# Patient Record
Sex: Female | Born: 1955 | Race: Black or African American | Hispanic: No | Marital: Married | Smoking: Never smoker
Health system: Southern US, Community
[De-identification: ages and names within clinical notes are randomized; demographics above are authoritative.]

## PROBLEM LIST (undated history)

## (undated) DIAGNOSIS — K219 Gastro-esophageal reflux disease without esophagitis: Secondary | ICD-10-CM

## (undated) DIAGNOSIS — E7800 Pure hypercholesterolemia, unspecified: Secondary | ICD-10-CM

## (undated) DIAGNOSIS — I10 Essential (primary) hypertension: Secondary | ICD-10-CM

## (undated) DIAGNOSIS — I890 Lymphedema, not elsewhere classified: Secondary | ICD-10-CM

## (undated) DIAGNOSIS — T7840XA Allergy, unspecified, initial encounter: Secondary | ICD-10-CM

## (undated) DIAGNOSIS — E039 Hypothyroidism, unspecified: Secondary | ICD-10-CM

## (undated) DIAGNOSIS — E0500 Thyrotoxicosis with diffuse goiter without thyrotoxic crisis or storm: Secondary | ICD-10-CM

## (undated) HISTORY — DX: Allergy, unspecified, initial encounter: T78.40XA

## (undated) HISTORY — DX: Pure hypercholesterolemia, unspecified: E78.00

## (undated) HISTORY — DX: Thyrotoxicosis with diffuse goiter without thyrotoxic crisis or storm: E05.00

## (undated) HISTORY — DX: Lymphedema, not elsewhere classified: I89.0

## (undated) HISTORY — DX: Gastro-esophageal reflux disease without esophagitis: K21.9

## (undated) HISTORY — DX: Hypothyroidism, unspecified: E03.9

## (undated) HISTORY — DX: Essential (primary) hypertension: I10

## (undated) HISTORY — PX: TUBAL LIGATION: SHX77

---

## 1997-12-22 ENCOUNTER — Ambulatory Visit (HOSPITAL_COMMUNITY): Admission: RE | Admit: 1997-12-22 | Discharge: 1997-12-22 | Payer: Self-pay | Admitting: Internal Medicine

## 1998-12-20 ENCOUNTER — Ambulatory Visit (HOSPITAL_COMMUNITY): Admission: RE | Admit: 1998-12-20 | Discharge: 1998-12-20 | Payer: Self-pay | Admitting: *Deleted

## 1999-12-25 ENCOUNTER — Other Ambulatory Visit: Admission: RE | Admit: 1999-12-25 | Discharge: 1999-12-25 | Payer: Self-pay | Admitting: *Deleted

## 2000-02-21 ENCOUNTER — Encounter: Payer: Self-pay | Admitting: *Deleted

## 2000-02-21 ENCOUNTER — Ambulatory Visit (HOSPITAL_COMMUNITY): Admission: RE | Admit: 2000-02-21 | Discharge: 2000-02-21 | Payer: Self-pay | Admitting: *Deleted

## 2001-07-25 ENCOUNTER — Ambulatory Visit (HOSPITAL_COMMUNITY): Admission: RE | Admit: 2001-07-25 | Discharge: 2001-07-25 | Payer: Self-pay | Admitting: Family Medicine

## 2003-11-03 ENCOUNTER — Other Ambulatory Visit: Admission: RE | Admit: 2003-11-03 | Discharge: 2003-11-03 | Payer: Self-pay | Admitting: Family Medicine

## 2004-09-06 ENCOUNTER — Encounter: Admission: RE | Admit: 2004-09-06 | Discharge: 2004-09-06 | Payer: Self-pay | Admitting: Allergy and Immunology

## 2005-09-08 HISTORY — PX: ABLATION: SHX5711

## 2005-11-11 ENCOUNTER — Ambulatory Visit (HOSPITAL_COMMUNITY): Admission: RE | Admit: 2005-11-11 | Discharge: 2005-11-11 | Payer: Self-pay | Admitting: Family Medicine

## 2005-11-18 ENCOUNTER — Other Ambulatory Visit: Admission: RE | Admit: 2005-11-18 | Discharge: 2005-11-18 | Payer: Self-pay | Admitting: Family Medicine

## 2006-01-08 ENCOUNTER — Encounter (HOSPITAL_COMMUNITY): Admission: RE | Admit: 2006-01-08 | Discharge: 2006-04-08 | Payer: Self-pay | Admitting: Family Medicine

## 2006-02-06 ENCOUNTER — Ambulatory Visit (HOSPITAL_COMMUNITY): Admission: RE | Admit: 2006-02-06 | Discharge: 2006-02-06 | Payer: Self-pay | Admitting: Family Medicine

## 2006-10-10 ENCOUNTER — Emergency Department (HOSPITAL_COMMUNITY): Admission: EM | Admit: 2006-10-10 | Discharge: 2006-10-10 | Payer: Self-pay | Admitting: Family Medicine

## 2006-10-10 ENCOUNTER — Inpatient Hospital Stay (HOSPITAL_COMMUNITY): Admission: AD | Admit: 2006-10-10 | Discharge: 2006-10-14 | Payer: Self-pay | Admitting: Internal Medicine

## 2006-10-11 ENCOUNTER — Ambulatory Visit: Payer: Self-pay | Admitting: Vascular Surgery

## 2006-11-30 ENCOUNTER — Other Ambulatory Visit: Admission: RE | Admit: 2006-11-30 | Discharge: 2006-11-30 | Payer: Self-pay | Admitting: Family Medicine

## 2006-12-01 ENCOUNTER — Ambulatory Visit (HOSPITAL_COMMUNITY): Admission: RE | Admit: 2006-12-01 | Discharge: 2006-12-01 | Payer: Self-pay | Admitting: Family Medicine

## 2007-11-30 ENCOUNTER — Other Ambulatory Visit: Admission: RE | Admit: 2007-11-30 | Discharge: 2007-11-30 | Payer: Self-pay | Admitting: Family Medicine

## 2007-12-07 ENCOUNTER — Ambulatory Visit (HOSPITAL_COMMUNITY): Admission: RE | Admit: 2007-12-07 | Discharge: 2007-12-07 | Payer: Self-pay | Admitting: Family Medicine

## 2008-09-08 HISTORY — PX: UTERINE FIBROID EMBOLIZATION: SHX825

## 2008-10-11 ENCOUNTER — Encounter: Admission: RE | Admit: 2008-10-11 | Discharge: 2008-10-11 | Payer: Self-pay | Admitting: Obstetrics and Gynecology

## 2008-10-24 ENCOUNTER — Encounter: Admission: RE | Admit: 2008-10-24 | Discharge: 2008-10-24 | Payer: Self-pay | Admitting: Obstetrics and Gynecology

## 2008-11-21 ENCOUNTER — Ambulatory Visit (HOSPITAL_COMMUNITY): Admission: RE | Admit: 2008-11-21 | Discharge: 2008-11-22 | Payer: Self-pay | Admitting: Interventional Radiology

## 2008-12-06 ENCOUNTER — Encounter: Admission: RE | Admit: 2008-12-06 | Discharge: 2008-12-06 | Payer: Self-pay | Admitting: Interventional Radiology

## 2008-12-15 ENCOUNTER — Ambulatory Visit (HOSPITAL_COMMUNITY): Admission: RE | Admit: 2008-12-15 | Discharge: 2008-12-15 | Payer: Self-pay | Admitting: Family Medicine

## 2009-04-05 ENCOUNTER — Ambulatory Visit (HOSPITAL_COMMUNITY): Admission: RE | Admit: 2009-04-05 | Discharge: 2009-04-05 | Payer: Self-pay | Admitting: Gastroenterology

## 2009-04-05 ENCOUNTER — Encounter (INDEPENDENT_AMBULATORY_CARE_PROVIDER_SITE_OTHER): Payer: Self-pay | Admitting: Gastroenterology

## 2009-05-27 IMAGING — XA IR UTERINE FIBROID EMBOLIZATION
1 series · 14 of 24 positions shown · non-contrast
Comparison: none

CLINICAL DATA: symptomatic uterine fibroids.

 BILATERAL UTERINE ARTERY EMBOLIZATION:
TECHNIQUE: The procedure, risks, benefits, and alternatives were
explained to the patient.  Questions regarding the procedure were
encouraged and answered.  The patient understands and consents to
the procedure. Prophylactic antibiotics were ordered and
administered within one hour of procedure start time. An
appropriate skin entry site was determined under fluoroscopy. Skin
site was marked, prepped with Betadine, and draped in usual sterile
fashion, and infiltrated locally with 1% lidocaine. Intravenous
fentanyl and Versed were administered as conscious sedation during
continuous cardiorespiratory monitoring by the radiology RN.

[Series 1000: run · 0.10mm/px · 14 of 39 slices shown]
[im 1/39]
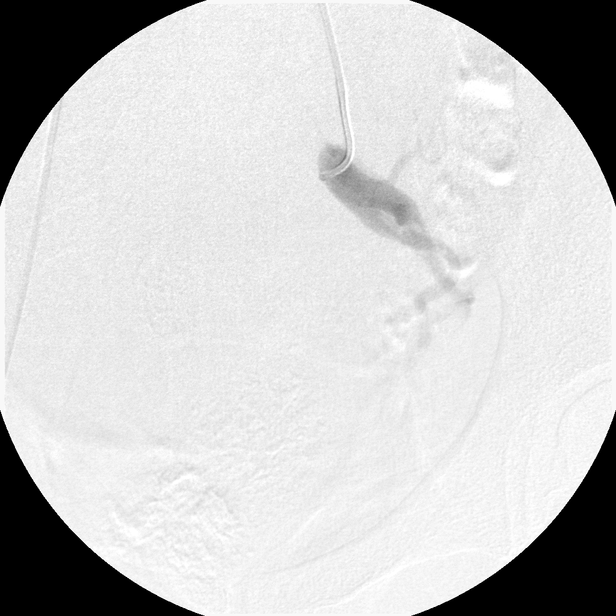
[im 4/39]
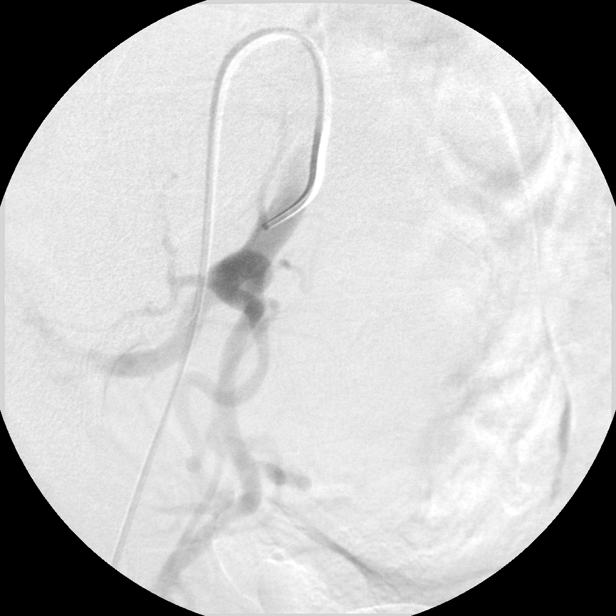
[im 7/39]
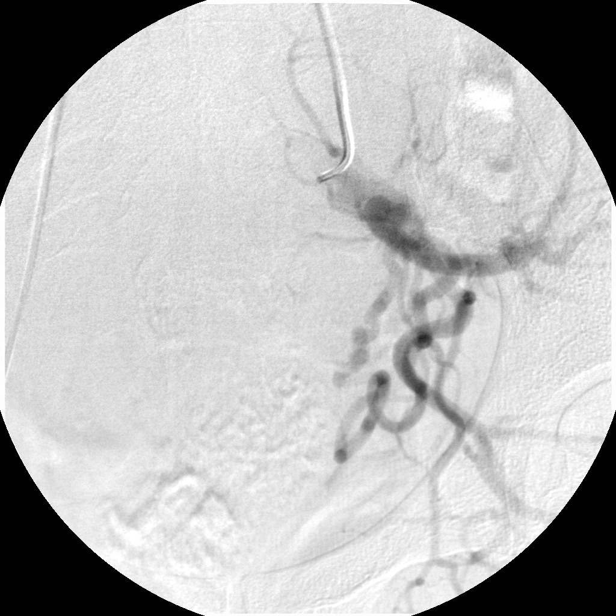
[im 10/39]
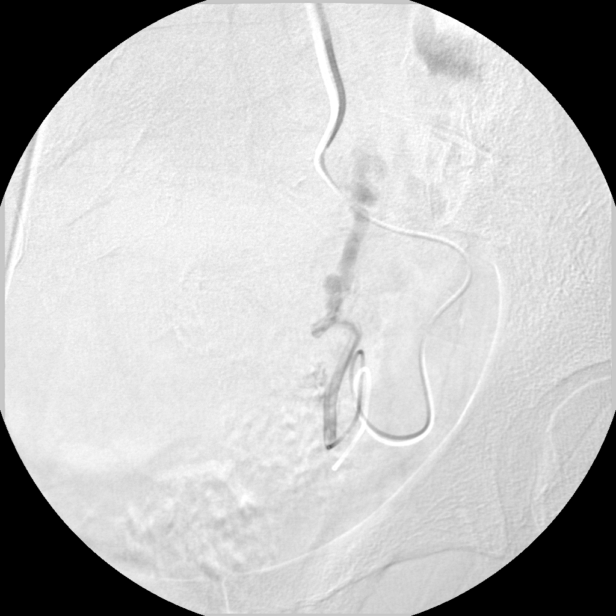
[im 12/39]
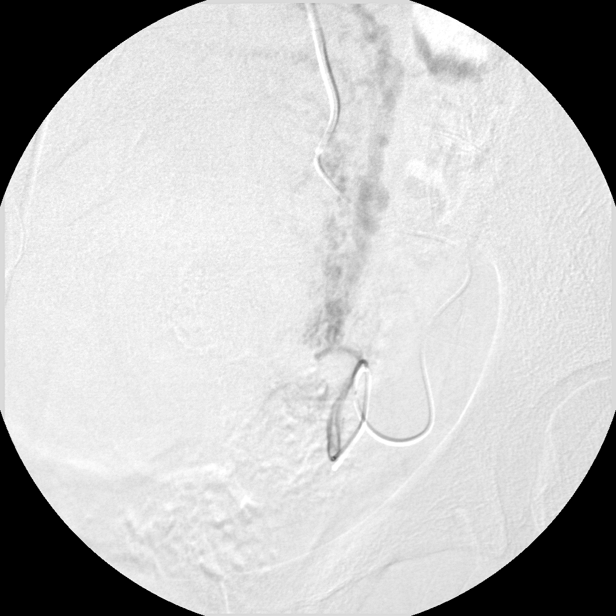
[im 15/39]
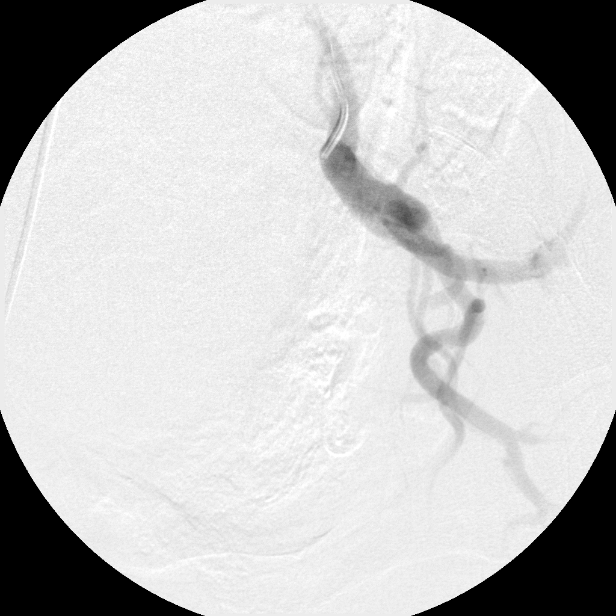
[im 19/39]
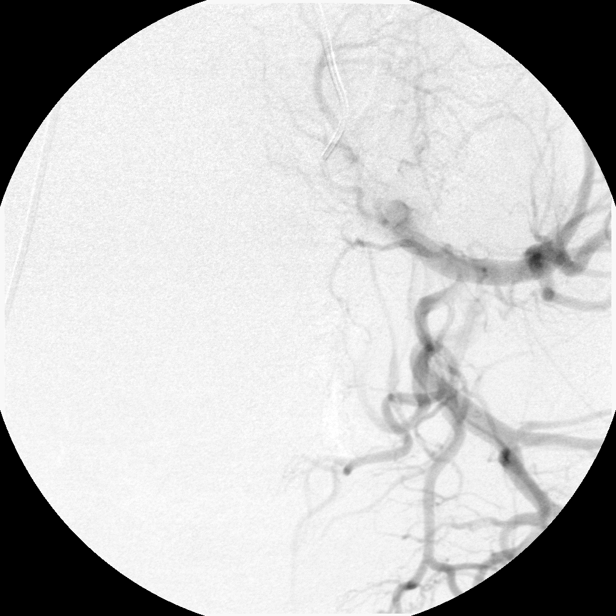
[im 20/39]
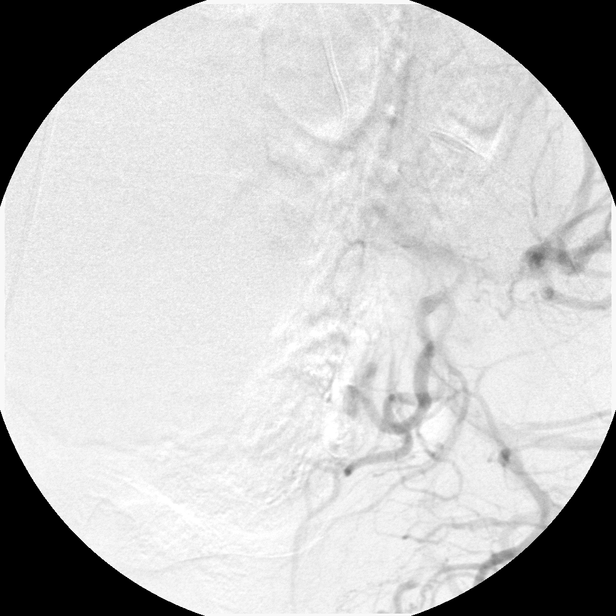
[im 24/39]
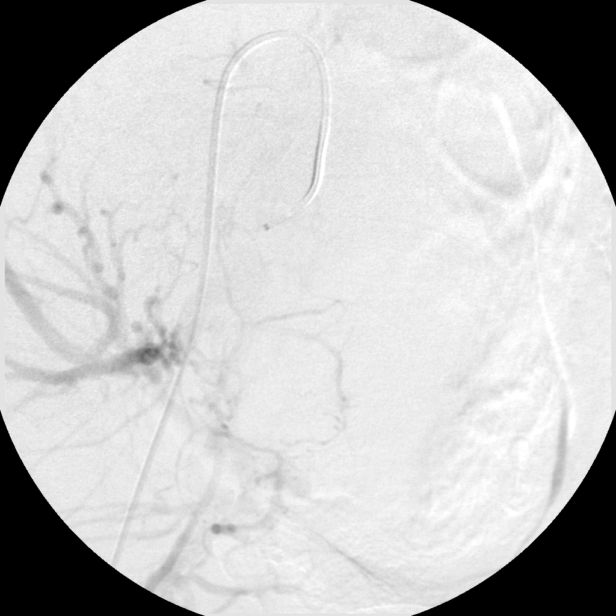
[im 27/39]
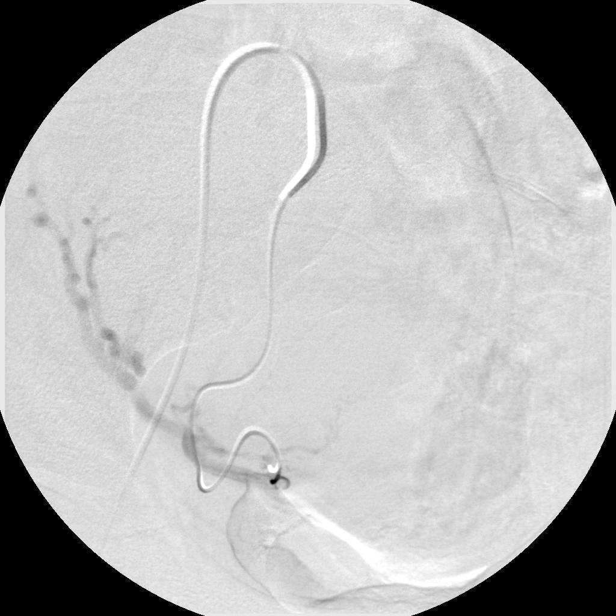
[im 30/39]
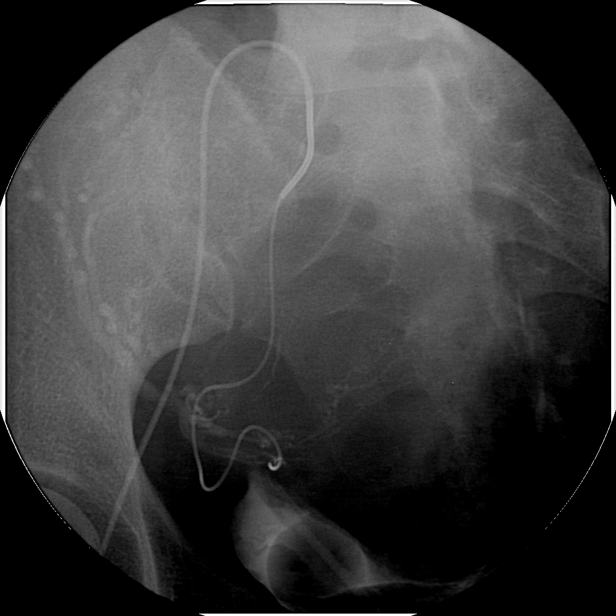
[im 32/39]
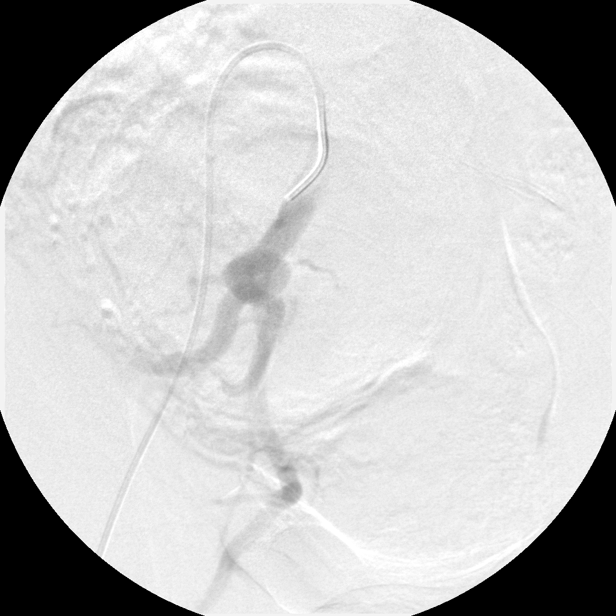
[im 35/39]
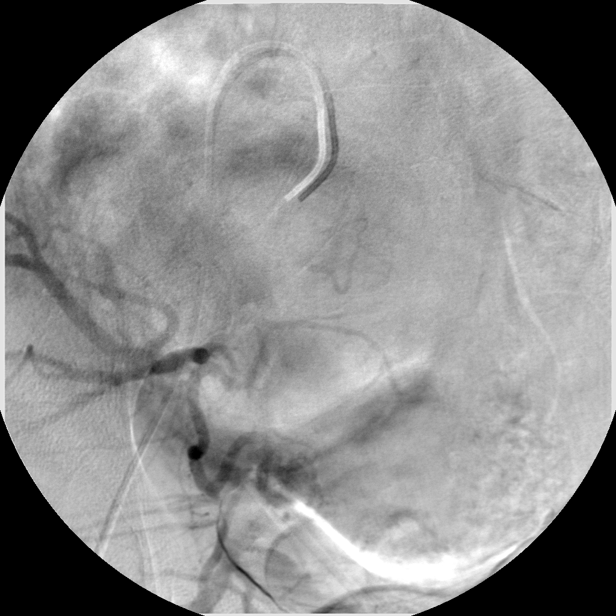
[im 39/39]
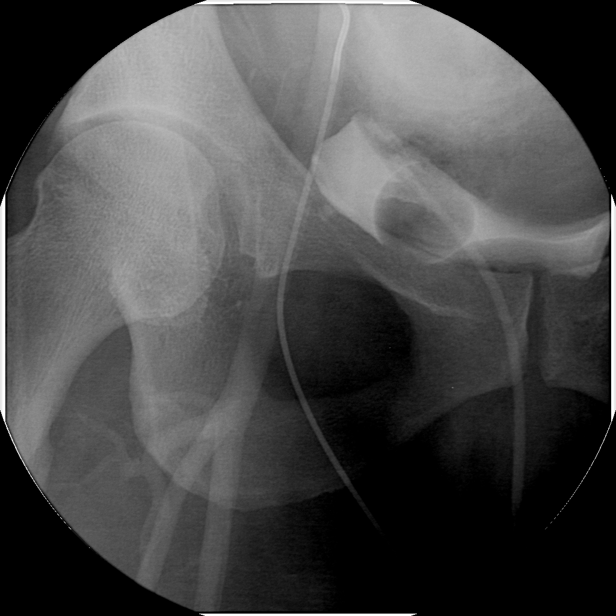

[14 of 24 positions shown; findings below may reference images not displayed]

Under real-time ultrasound guidance, the right common femoral
artery was accessed with a 19-gauge needle using singlewall
technique in a single pass. Ultrasound imaging documentation was
saved. Needle was exchanged over a Benson wire for a 5 French C2
catheter, used to selectively catheterize the left internal iliac
artery for pelvic arteriography. A coaxial Progreat catheter was
advanced with the Terumo wire and used to selectively catheterize
the left uterine artery. The microcatheter tip was positioned in
the distal horizontal segment. Selective arteriogram confirms
appropriate positioning. Distal branches of the left uterine artery
were embolized with 500-700 micron Embospheres.  Embolization
continued until near stasis of flow was achieved. Microcatheter was
withdrawn and a followup selective left internal iliac arteriogram
was obtained. Woo Chul Jeohn loop was then formed with the C2 catheter,
and the right internal iliac artery was selectively catheterized.
Again the Progreat catheter with Terumo guidewire was coaxially
advanced and used to selectively catheterize the right uterine
artery. Confirmatory arteriogram was performed.  Right uterine
artery branches were embolized with 500-700 micron Embospheres to
near stasis of flow. A total of 8 vials of Embospheres were
utilized for the case. Microcatheter was withdrawn and a followup
arteriogram of the right internal iliac artery was performed. C2
catheter was exchanged over the Benson wire for a vascular sheath
allowing delivery of the Starclose device external to the right
common femoral artery. Hemostasis was achieved. No immediate
complication. Patient tolerated the procedure well.
IMPRESSION: 1. Technically successful bilateral uterine artery embolization
using 500-722micron Embospheres.

## 2009-05-30 ENCOUNTER — Encounter: Admission: RE | Admit: 2009-05-30 | Discharge: 2009-05-30 | Payer: Self-pay | Admitting: Obstetrics and Gynecology

## 2009-07-05 ENCOUNTER — Other Ambulatory Visit: Admission: RE | Admit: 2009-07-05 | Discharge: 2009-07-05 | Payer: Self-pay | Admitting: Obstetrics and Gynecology

## 2009-12-17 ENCOUNTER — Ambulatory Visit (HOSPITAL_COMMUNITY): Admission: RE | Admit: 2009-12-17 | Discharge: 2009-12-17 | Payer: Self-pay | Admitting: Family Medicine

## 2010-07-08 ENCOUNTER — Other Ambulatory Visit: Admission: RE | Admit: 2010-07-08 | Discharge: 2010-07-08 | Payer: Self-pay | Admitting: Obstetrics and Gynecology

## 2010-09-10 ENCOUNTER — Ambulatory Visit
Admission: RE | Admit: 2010-09-10 | Discharge: 2010-09-10 | Payer: Self-pay | Source: Home / Self Care | Attending: Vascular Surgery | Admitting: Vascular Surgery

## 2010-09-18 ENCOUNTER — Ambulatory Visit
Admission: RE | Admit: 2010-09-18 | Discharge: 2010-09-18 | Payer: Self-pay | Source: Home / Self Care | Attending: Vascular Surgery | Admitting: Vascular Surgery

## 2010-12-03 ENCOUNTER — Other Ambulatory Visit (HOSPITAL_COMMUNITY): Payer: Self-pay | Admitting: Family Medicine

## 2010-12-03 DIAGNOSIS — Z1231 Encounter for screening mammogram for malignant neoplasm of breast: Secondary | ICD-10-CM

## 2010-12-19 ENCOUNTER — Ambulatory Visit (HOSPITAL_COMMUNITY)
Admission: RE | Admit: 2010-12-19 | Discharge: 2010-12-19 | Disposition: A | Discharge status: Home / Self Care | Payer: 59 | Source: Ambulatory Visit | Attending: Family Medicine | Admitting: Family Medicine

## 2010-12-19 DIAGNOSIS — Z1231 Encounter for screening mammogram for malignant neoplasm of breast: Secondary | ICD-10-CM | POA: Insufficient documentation

## 2010-12-19 LAB — CBC
Hemoglobin: 10.6 g/dL — ABNORMAL LOW (ref 12.0–15.0)
MCV: 79.9 fL (ref 78.0–100.0)
Platelets: 229 10*3/uL (ref 150–400)
RBC: 4.16 MIL/uL (ref 3.87–5.11)
RDW: 16.9 % — ABNORMAL HIGH (ref 11.5–15.5)
WBC: 4.2 10*3/uL (ref 4.0–10.5)

## 2010-12-19 LAB — HCG, SERUM, QUALITATIVE: Preg, Serum: NEGATIVE

## 2010-12-19 LAB — CREATININE, SERUM: GFR calc non Af Amer: >60 mL/min (ref >60)

## 2011-01-21 NOTE — Op Note (Signed)
NAME:  [name], [name]               ACCOUNT NO.:  [id number]   MEDICAL RECORD NO.:  [id number]          PATIENT TYPE:  AMB   LOCATION:  ENDO                         FACILITY:  [facility]   PHYSICIAN:  Graylin Shiver, M.D.   DATE OF BIRTH:  [birth year]   DATE OF PROCEDURE:  04/05/2009  DATE OF DISCHARGE:                               OPERATIVE REPORT   PROCEDURE:  Colonoscopy with biopsy.   INDICATION:  Screening.   Informed consent was obtained after explanation of the risks of  bleeding, infection and perforation.   PREMEDICATION:  Fentanyl 75 mcg IV, Versed 7.5 mg IV.   PROCEDURE IN DETAIL:  With the patient in the left lateral decubitus  position a rectal exam was performed and no masses were felt.  The  Pentax colonoscope was inserted into the rectum and advanced around the  colon to the cecum.  Cecal landmarks were identified.  The cecum looked  normal.  There were some scattered diverticula in the descending colon  and transverse colon.  In the transverse colon there was a diminutive  polyp which was biopsied off with cold forceps.  The descending colon  showed a few diverticula.  The sigmoid and rectum were normal.  She  tolerated the procedure well without complications.   IMPRESSION:  1. Scattered diverticulosis.  2. Diminutive polyp in the transverse colon biopsied off.   PLAN:  Pathology will be checked.           ______________________________  Graylin Shiver, M.D.     SFG/MEDQ  D:  04/05/2009  T:  04/05/2009  Job:  [id number]   cc:   Gretta Arab. Valentina Lucks, M.D.  Fax: [phone]

## 2011-01-21 NOTE — Consult Note (Signed)
NEW PATIENT CONSULTATION   OWEN, PAGNOTTA C  DOB:  1955-11-11                                       09/10/2010  FAOZH#:[id number]   The patient presents today for evaluation of right greater than left leg  swelling.  She reports this has been present for quite a few years and  has had several episodes of cellulitis in her right leg related to the  swelling.  She had tried diuretic treatment with minimal response.  She  has been diagnosed as right leg lymphedema.  She does not have any  history of DVT.  She has responded to antibiotics when she has developed  cellulitis in her right leg.  She is not diabetic.  She does have  history of thyrotoxicosis and no history of cardiac disease.   SOCIAL HISTORY:  She is married with 3 children.  She does not work  outside the home.  She does not smoke or drink alcohol.   FAMILY HISTORY:  Negative for premature atherosclerotic disease.   REVIEW OF SYSTEMS:  She does have weight gain up to 190 pounds.  She is  5 feet 1 inch tall.  She does have some pain in her heels with walking.  Review of systems otherwise negative.   PHYSICAL EXAMINATION:  Vital signs:  Blood pressure is 137/76, heart  rate is 81, respirations 16, oxygen saturations are 100% on room air.  HEENT:  Normal.  Her radial and dorsalis pedis pulses are 2+  bilaterally.  She does have edema in her right leg and mild in her left  leg.  This is more typical of lymphedema extending down on to the dorsum  of her foot and also on to her toes.  Neurologic:  No focal weakness,  paresthesias.  Skin:  She does not have any open ulcers or rashes.  Musculoskeletal:  There are no major deformities or cyanosis.   She underwent screening ultrasound with myself with the handheld duplex  and this shows normal function of her saphenous vein bilaterally.  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  I  explained that to the patient that there is no  known specific etiology  of this and no surgical treatment.  I did explain the importance of  compression and potentially therapeutic massage as well.  We have  referred her to a lymphedema specialist, Oleh Genin, and have made  this referral for further recommendations regarding therapeutic massage.  She was fitted with knee-high 20 to 30 mmHg graduated compression  stockings today and instructed on the use of these pending her  appointment with the lymphedema specialist.     Larina Earthly, M.D.  Electronically Signed   TFE/MEDQ  D:  09/10/2010  T:  09/10/2010  Job:  [id number]   cc:   Gretta Arab. Valentina Lucks, M.D.

## 2011-01-24 NOTE — H&P (Signed)
NAMESEANA, [name]               ACCOUNT NO.:  [id number]   MEDICAL RECORD NO.:  [id number]          PATIENT TYPE:  INP   LOCATION:  3005                         FACILITY:  [facility]   PHYSICIAN:  Kela Millin, M.D.DATE OF BIRTH:  Aug 27, 1956   DATE OF ADMISSION:  10/10/2006  DATE OF DISCHARGE:                              HISTORY & PHYSICAL   PRIMARY CARE PHYSICIAN:  Dr. Maurice Small.   CHIEF COMPLAINT:  Worsening right leg swelling and pain.   HISTORY OF PRESENT ILLNESS:  The patient is a 55 year old black female  with past medical history significant for hypothyroidism and recently  diagnosed with right lower extremity cellulitis and started on oral  antibiotics per primary care physician who presents with above  complaints.  She states that she was in her usual state of health until  about a week ago when she developed some swelling in her right leg.  By  the next day, it became more painful and also red and so she went in to  see her primary care physician and the physician assistant started her  on empiric antibiotics as well as hydrochlorothiazide for the swelling  and this was done about 5 days ago.  She states that the redness  improved after she started the antibiotics but the swelling and pain  have continued to worsen and so she came to Urgent Care today and was  directly admitted to the [facility] hospitalist service for further  evaluation and management.  The patient denies any insect bites, also  denies any trauma.  She admits to a history of childhood eczema.  She  denies fevers, cough, dysuria, chest pain, melena, diarrhea, and no  hematochezia.  She states that the swelling is mostly on the posterior  medial aspect of her right leg from her upper thigh area all the way  down to her ankle area.   PAST MEDICAL HISTORY:  As stated above.   MEDICATIONS:  1. Synthroid 125 mcg p.o. daily.  2. Septra DS one tablet p.o. b.i.d.  3. Hydrochlorothiazide 12.5 mg p.o.  daily.   ALLERGIES/INTOLERANCES:  HYDROCODONE.   SOCIAL HISTORY:  She denies tobacco.  She also denies alcohol.   FAMILY HISTORY:  Her mother has hypertension.  Her grandmother also had  hypertension.  Her sister has breast cancer.   REVIEW OF SYSTEMS:  As per HPI, other review of systems negative.   PHYSICAL EXAMINATION:  GENERAL:  The patient is a pleasant middle-aged  black female well-developed, well-nourished in no respiratory distress.  VITAL SIGNS:  Temperature is 98.5.  There is a pulse of 94.  Respiratory  rate of 20.  Blood pressure is 117/75.  O2 saturation of 97%.  HEENT:  PERRL, EOMI.  Sclerae are anicteric.  Moist mucous membranes.  No oral exudates.  NECK:  Supple.  No adenopathy.  No thyromegaly.  No JVD.  LUNGS:  Clear to auscultation bilaterally; no crackles or wheezes.  CARDIOVASCULAR:  Regular rate and rhythm.  Normal S1 and S2.  No  murmurs.  No gallops.  ABDOMEN:  Soft.  Bowel sounds present.  Nontender.  Nondistended.  No  organomegaly.  No masses palpable.  EXTREMITIES:  Her right lower extremity is significantly larger in  circumference than the left extremity and on the posterior medial aspect  from her upper thigh down to her ankle area is discolored, edematous,  tender.  Left lower extremity:  No edema.  No cyanosis.  NEURO:  Alert and oriented x3.  Cranial nerves II-XII are grossly  intact.  A nonfocal exam.   LABORATORY DATA:  Her white cell count is 4.2, hemoglobin initially 11.6  with a hematocrit of 34 and on repeat labs of a hemoglobin 8.9 with a  hematocrit of 28.6 and an MCV 73.4 and her platelet count 346.  Her  sedimentation rate is 53.  Her pH is 7.39 with a PCO2 of 39.5, a  bicarbonate of 24.2.   ASSESSMENT/PLAN:  1. Right lower extremity cellulitis with ?lymphedema.  We will obtain      a Doppler ultrasound to rule out a DVT.  We will start on empiric      antibiotics including MRSA coverage.  We will also obtain cultures      and  chemistries including liver function tests and follow.  2. Hypothyroidism-continue Synthroid.  3. Anemia, microcytic-recheck H&H, anemia workup and follow.      Kela Millin, M.D.  Electronically Signed     ACV/MEDQ  D:  10/10/2006  T:  10/10/2006  Job:  [id number]   cc:   Gretta Arab. Valentina Lucks, M.D.

## 2011-01-24 NOTE — Discharge Summary (Signed)
[name], [name]               ACCOUNT NO.:  [id number]   MEDICAL RECORD NO.:  [id number]          PATIENT TYPE:  INP   LOCATION:  3005                         FACILITY:  [facility]   PHYSICIAN:  Hollice Espy, M.D.DATE OF BIRTH:  1956-01-15   DATE OF ADMISSION:  10/10/2006  DATE OF DISCHARGE:  10/14/2006                               DISCHARGE SUMMARY   DISCHARGE DIAGNOSES:  1. Cellulitis of the right inner thigh.  2. Chronic iron deficiency anemia, felt to be secondary to number 3.  3. Heavy menses.  4. History of hypothyroidism.   DISCHARGE MEDICATIONS:  For this patient are as follows:  1. New medication of iron 325 mg p.o. b.i.d.  2. IV Levaquin 500 mg daily for the next 6 days.  This medication may      substituted to IV Avalox 400 mg daily x6 days pending on patient's      home health agent and insurance covering that.  Patient will, otherwise, continue the rest of her previous medications,  these are as follows:  1. Synthroid 0.25 mcg daily.  2. __________ 5 p.o. daily.  3. __________ .   HOSPITAL COURSE:  The patient is a very pleasant 55 year old white  female with past medical history as outlined above who was admitted to  the hospital on October 10, 2006 after failing outpatient therapy for  her right inner thigh cellulitis.  She has significant erythema and  induration.  She was started on IV Unasyn and vancomycin.  Her symptoms  quickly regressed and initially the plan was by then, as of October 13, 2006, to discharge the patient on p.o. antibiotics.  By October 14, 2006, patient was feeling better.  Her erythema had improved; however,  she still had significant amount of induration.  I was concerned about  the penetration of the oral antibiotics to complete her antibiotic  course and felt it best that she continue on IV antibiotics.  Since she  was, otherwise, was doing well it was felt best the patient could go  home onIV antibiotics  daily and daily nebs  for ease of use and the fact  that she has no complicating, such as diabetes with this medication.  The patient will be discharged home for another 6 more days and she will  followup next week with her PCP, Dr. Maurice Small, who at that time  will evaluate the patient to see if she needs any further antibiotic  therapy.  In regards to her iron deficiency anemia, labs were checked on  this patient &  she was found to have a low hemoglobin of 8.7.  She  showed no signs of GI blood loss, but had stated that she had her menses  recently and every time she has her menses they are quite heavy.  An MPE  was checked and found to be low at 73.  It was strongly felt the patient  had chronic iron deficiency anemia and decreased platelets.  We had  started the patient on iron 325 p.o. b.i.d.  The rest of her medical  issues, at  this time, remain stable.  She is being discharged to home to  follow up with her PCP in the next week.  Home health to follow the  patient.   DISCHARGE DIET:  Regular diet.   ACTIVITIES:  Slow to increase      Hollice Espy, M.D.  Electronically Signed     SKK/MEDQ  D:  10/14/2006  T:  10/15/2006  Job:  [id number]   cc:   Gretta Arab. Valentina Lucks, M.D.

## 2011-07-09 ENCOUNTER — Other Ambulatory Visit: Payer: Self-pay | Admitting: Obstetrics and Gynecology

## 2011-07-09 ENCOUNTER — Other Ambulatory Visit (HOSPITAL_COMMUNITY)
Admission: RE | Admit: 2011-07-09 | Discharge: 2011-07-09 | Disposition: A | Discharge status: Home / Self Care | Payer: 59 | Source: Ambulatory Visit | Attending: Obstetrics and Gynecology | Admitting: Obstetrics and Gynecology

## 2011-07-09 DIAGNOSIS — Z01419 Encounter for gynecological examination (general) (routine) without abnormal findings: Secondary | ICD-10-CM | POA: Insufficient documentation

## 2011-12-18 ENCOUNTER — Other Ambulatory Visit (HOSPITAL_COMMUNITY): Payer: Self-pay | Admitting: Family Medicine

## 2011-12-18 DIAGNOSIS — Z1231 Encounter for screening mammogram for malignant neoplasm of breast: Secondary | ICD-10-CM

## 2012-01-01 ENCOUNTER — Ambulatory Visit (HOSPITAL_COMMUNITY)
Admission: RE | Admit: 2012-01-01 | Discharge: 2012-01-01 | Disposition: A | Discharge status: Home / Self Care | Payer: 59 | Source: Ambulatory Visit | Attending: Family Medicine | Admitting: Family Medicine

## 2012-01-01 DIAGNOSIS — Z1231 Encounter for screening mammogram for malignant neoplasm of breast: Secondary | ICD-10-CM | POA: Insufficient documentation

## 2012-07-08 ENCOUNTER — Other Ambulatory Visit: Payer: Self-pay | Admitting: Obstetrics and Gynecology

## 2012-07-08 ENCOUNTER — Other Ambulatory Visit (HOSPITAL_COMMUNITY)
Admission: RE | Admit: 2012-07-08 | Discharge: 2012-07-08 | Disposition: A | Discharge status: Home / Self Care | Payer: 59 | Source: Ambulatory Visit | Attending: Obstetrics and Gynecology | Admitting: Obstetrics and Gynecology

## 2012-07-08 DIAGNOSIS — Z01419 Encounter for gynecological examination (general) (routine) without abnormal findings: Secondary | ICD-10-CM | POA: Insufficient documentation

## 2012-12-07 ENCOUNTER — Other Ambulatory Visit (HOSPITAL_COMMUNITY): Payer: Self-pay | Admitting: Family Medicine

## 2012-12-07 DIAGNOSIS — Z1231 Encounter for screening mammogram for malignant neoplasm of breast: Secondary | ICD-10-CM

## 2013-01-03 ENCOUNTER — Ambulatory Visit (HOSPITAL_COMMUNITY)
Admission: RE | Admit: 2013-01-03 | Discharge: 2013-01-03 | Disposition: A | Discharge status: Home / Self Care | Payer: 59 | Source: Ambulatory Visit | Attending: Family Medicine | Admitting: Family Medicine

## 2013-01-03 DIAGNOSIS — Z1231 Encounter for screening mammogram for malignant neoplasm of breast: Secondary | ICD-10-CM | POA: Insufficient documentation

## 2013-07-12 ENCOUNTER — Other Ambulatory Visit (HOSPITAL_COMMUNITY)
Admission: RE | Admit: 2013-07-12 | Discharge: 2013-07-12 | Disposition: A | Discharge status: Home / Self Care | Payer: 59 | Source: Ambulatory Visit | Attending: Obstetrics and Gynecology | Admitting: Obstetrics and Gynecology

## 2013-07-12 ENCOUNTER — Other Ambulatory Visit: Payer: Self-pay | Admitting: Obstetrics and Gynecology

## 2013-07-12 DIAGNOSIS — Z1151 Encounter for screening for human papillomavirus (HPV): Secondary | ICD-10-CM | POA: Insufficient documentation

## 2013-07-12 DIAGNOSIS — Z01419 Encounter for gynecological examination (general) (routine) without abnormal findings: Secondary | ICD-10-CM | POA: Insufficient documentation

## 2013-11-30 ENCOUNTER — Other Ambulatory Visit: Payer: Self-pay

## 2013-11-30 DIAGNOSIS — R002 Palpitations: Secondary | ICD-10-CM

## 2013-12-06 ENCOUNTER — Other Ambulatory Visit (HOSPITAL_COMMUNITY): Payer: Self-pay | Admitting: Cardiology

## 2013-12-06 DIAGNOSIS — R0989 Other specified symptoms and signs involving the circulatory and respiratory systems: Secondary | ICD-10-CM

## 2013-12-07 ENCOUNTER — Encounter (INDEPENDENT_AMBULATORY_CARE_PROVIDER_SITE_OTHER): Payer: 59

## 2013-12-07 ENCOUNTER — Encounter: Payer: Self-pay | Admitting: Cardiology

## 2013-12-07 ENCOUNTER — Ambulatory Visit (HOSPITAL_COMMUNITY): Payer: 59 | Attending: Cardiology | Admitting: Cardiology

## 2013-12-07 ENCOUNTER — Encounter: Payer: Self-pay | Admitting: *Deleted

## 2013-12-07 DIAGNOSIS — I6529 Occlusion and stenosis of unspecified carotid artery: Secondary | ICD-10-CM

## 2013-12-07 DIAGNOSIS — R002 Palpitations: Secondary | ICD-10-CM

## 2013-12-07 DIAGNOSIS — R0989 Other specified symptoms and signs involving the circulatory and respiratory systems: Secondary | ICD-10-CM

## 2013-12-07 DIAGNOSIS — R42 Dizziness and giddiness: Secondary | ICD-10-CM

## 2013-12-07 NOTE — Progress Notes (Signed)
Patient ID: [name], female   DOB: 1956-07-29, 58 y.o.   MRN: [id number] [employer] 30 day cardiac event monitor applied to patient.

## 2013-12-07 NOTE — Progress Notes (Signed)
Carotid duplex complete 

## 2014-02-02 ENCOUNTER — Other Ambulatory Visit (HOSPITAL_COMMUNITY): Payer: Self-pay | Admitting: Family Medicine

## 2014-02-02 DIAGNOSIS — Z1231 Encounter for screening mammogram for malignant neoplasm of breast: Secondary | ICD-10-CM

## 2014-02-15 ENCOUNTER — Ambulatory Visit (HOSPITAL_COMMUNITY)
Admission: RE | Admit: 2014-02-15 | Discharge: 2014-02-15 | Disposition: A | Discharge status: Home / Self Care | Payer: 59 | Source: Ambulatory Visit | Attending: Family Medicine | Admitting: Family Medicine

## 2014-02-15 DIAGNOSIS — Z803 Family history of malignant neoplasm of breast: Secondary | ICD-10-CM | POA: Insufficient documentation

## 2014-02-15 DIAGNOSIS — Z1231 Encounter for screening mammogram for malignant neoplasm of breast: Secondary | ICD-10-CM | POA: Insufficient documentation

## 2014-07-12 ENCOUNTER — Other Ambulatory Visit (HOSPITAL_COMMUNITY)
Admission: RE | Admit: 2014-07-12 | Discharge: 2014-07-12 | Disposition: A | Discharge status: Home / Self Care | Payer: 59 | Source: Ambulatory Visit | Attending: Obstetrics and Gynecology | Admitting: Obstetrics and Gynecology

## 2014-07-12 ENCOUNTER — Other Ambulatory Visit: Payer: Self-pay | Admitting: Obstetrics and Gynecology

## 2014-07-12 DIAGNOSIS — Z01419 Encounter for gynecological examination (general) (routine) without abnormal findings: Secondary | ICD-10-CM | POA: Insufficient documentation

## 2014-07-13 LAB — CYTOLOGY - PAP

## 2014-11-23 ENCOUNTER — Other Ambulatory Visit (HOSPITAL_COMMUNITY): Payer: Self-pay | Admitting: Cardiology

## 2014-11-23 DIAGNOSIS — I6523 Occlusion and stenosis of bilateral carotid arteries: Secondary | ICD-10-CM

## 2014-11-24 ENCOUNTER — Ambulatory Visit (HOSPITAL_COMMUNITY): Payer: 59 | Attending: Cardiology | Admitting: *Deleted

## 2014-11-24 DIAGNOSIS — I6523 Occlusion and stenosis of bilateral carotid arteries: Secondary | ICD-10-CM | POA: Diagnosis not present

## 2014-11-24 DIAGNOSIS — R42 Dizziness and giddiness: Secondary | ICD-10-CM

## 2014-11-24 DIAGNOSIS — R0989 Other specified symptoms and signs involving the circulatory and respiratory systems: Secondary | ICD-10-CM

## 2014-11-24 NOTE — Progress Notes (Signed)
Carotid Duplex Scan Performed 

## 2015-02-13 ENCOUNTER — Other Ambulatory Visit (HOSPITAL_COMMUNITY): Payer: Self-pay | Admitting: Family Medicine

## 2015-02-13 DIAGNOSIS — Z1231 Encounter for screening mammogram for malignant neoplasm of breast: Secondary | ICD-10-CM

## 2015-02-20 ENCOUNTER — Ambulatory Visit (HOSPITAL_COMMUNITY)
Admission: RE | Admit: 2015-02-20 | Discharge: 2015-02-20 | Disposition: A | Discharge status: Home / Self Care | Payer: 59 | Source: Ambulatory Visit | Attending: Family Medicine | Admitting: Family Medicine

## 2015-02-20 DIAGNOSIS — Z1231 Encounter for screening mammogram for malignant neoplasm of breast: Secondary | ICD-10-CM | POA: Diagnosis not present

## 2015-07-17 ENCOUNTER — Other Ambulatory Visit: Payer: Self-pay | Admitting: Obstetrics and Gynecology

## 2015-07-17 ENCOUNTER — Other Ambulatory Visit (HOSPITAL_COMMUNITY)
Admission: RE | Admit: 2015-07-17 | Discharge: 2015-07-17 | Disposition: A | Discharge status: Home / Self Care | Payer: 59 | Source: Ambulatory Visit | Attending: Obstetrics and Gynecology | Admitting: Obstetrics and Gynecology

## 2015-07-17 DIAGNOSIS — Z01419 Encounter for gynecological examination (general) (routine) without abnormal findings: Secondary | ICD-10-CM | POA: Insufficient documentation

## 2015-07-18 LAB — CYTOLOGY - PAP

## 2015-09-10 MED FILL — SYNTHROID 112 MCG TABLET: 112 | 30 days supply | Qty: 30 | Fill #4

## 2015-09-10 MED FILL — CALCIUM 600 + VIT D 400 TAB: 600-400 | 75 days supply | Qty: 150 | Fill #2

## 2015-10-08 MED FILL — SYNTHROID 112 MCG TABLET: 112 | 30 days supply | Qty: 30 | Fill #5

## 2015-10-31 DIAGNOSIS — I10 Essential (primary) hypertension: Secondary | ICD-10-CM | POA: Diagnosis not present

## 2015-10-31 DIAGNOSIS — E7800 Pure hypercholesterolemia, unspecified: Secondary | ICD-10-CM | POA: Diagnosis not present

## 2015-10-31 DIAGNOSIS — E039 Hypothyroidism, unspecified: Secondary | ICD-10-CM | POA: Diagnosis not present

## 2015-10-31 DIAGNOSIS — I890 Lymphedema, not elsewhere classified: Secondary | ICD-10-CM | POA: Diagnosis not present

## 2015-11-06 DIAGNOSIS — I890 Lymphedema, not elsewhere classified: Secondary | ICD-10-CM | POA: Diagnosis not present

## 2015-11-06 MED FILL — SYNTHROID 112 MCG TABLET: 112 | 30 days supply | Qty: 30 | Fill #0

## 2015-11-13 MED FILL — DOXYCYCLINE HYCLATE 100 MG: 100 | 10 days supply | Qty: 20 | Fill #0

## 2015-11-19 DIAGNOSIS — I890 Lymphedema, not elsewhere classified: Secondary | ICD-10-CM | POA: Diagnosis not present

## 2015-11-21 DIAGNOSIS — I890 Lymphedema, not elsewhere classified: Secondary | ICD-10-CM | POA: Diagnosis not present

## 2015-11-23 DIAGNOSIS — I890 Lymphedema, not elsewhere classified: Secondary | ICD-10-CM | POA: Diagnosis not present

## 2015-11-27 DIAGNOSIS — I890 Lymphedema, not elsewhere classified: Secondary | ICD-10-CM | POA: Diagnosis not present

## 2015-11-29 DIAGNOSIS — I890 Lymphedema, not elsewhere classified: Secondary | ICD-10-CM | POA: Diagnosis not present

## 2015-12-03 DIAGNOSIS — I890 Lymphedema, not elsewhere classified: Secondary | ICD-10-CM | POA: Diagnosis not present

## 2015-12-05 DIAGNOSIS — I890 Lymphedema, not elsewhere classified: Secondary | ICD-10-CM | POA: Diagnosis not present

## 2015-12-05 MED FILL — LOSARTAN-HCTZ 100-12.5 MG T: 100-12.5 | 30 days supply | Qty: 30 | Fill #0

## 2015-12-05 MED FILL — SYNTHROID 112 MCG TABLET: 112 | 30 days supply | Qty: 30 | Fill #1

## 2015-12-07 DIAGNOSIS — I890 Lymphedema, not elsewhere classified: Secondary | ICD-10-CM | POA: Diagnosis not present

## 2015-12-12 DIAGNOSIS — I10 Essential (primary) hypertension: Secondary | ICD-10-CM | POA: Diagnosis not present

## 2016-01-07 MED FILL — SYNTHROID 112 MCG TABLET: 112 | 30 days supply | Qty: 30 | Fill #2

## 2016-02-06 MED FILL — SYNTHROID 112 MCG TABLET: 112 | 30 days supply | Qty: 30 | Fill #3

## 2016-02-13 ENCOUNTER — Other Ambulatory Visit: Payer: Self-pay | Admitting: Family Medicine

## 2016-02-13 ENCOUNTER — Other Ambulatory Visit: Payer: Self-pay

## 2016-02-13 DIAGNOSIS — Z1231 Encounter for screening mammogram for malignant neoplasm of breast: Secondary | ICD-10-CM

## 2016-02-29 ENCOUNTER — Ambulatory Visit
Admission: RE | Admit: 2016-02-29 | Discharge: 2016-02-29 | Disposition: A | Discharge status: Home / Self Care | Payer: 59 | Source: Ambulatory Visit | Attending: Family Medicine | Admitting: Family Medicine

## 2016-02-29 DIAGNOSIS — Z1231 Encounter for screening mammogram for malignant neoplasm of breast: Secondary | ICD-10-CM

## 2016-03-06 MED FILL — SYNTHROID 112 MCG TABLET: 112 | 30 days supply | Qty: 30 | Fill #4

## 2016-03-28 MED FILL — TRIAMCINOLONE 0.1% CREAM: 0.1 | 10 days supply | Qty: 15 | Fill #2

## 2016-04-07 MED FILL — SYNTHROID 112 MCG TABLET: 112 | 30 days supply | Qty: 30 | Fill #5

## 2016-04-11 DIAGNOSIS — E039 Hypothyroidism, unspecified: Secondary | ICD-10-CM | POA: Diagnosis not present

## 2016-04-11 DIAGNOSIS — E7800 Pure hypercholesterolemia, unspecified: Secondary | ICD-10-CM | POA: Diagnosis not present

## 2016-04-11 DIAGNOSIS — Z0000 Encounter for general adult medical examination without abnormal findings: Secondary | ICD-10-CM | POA: Diagnosis not present

## 2016-04-11 DIAGNOSIS — I10 Essential (primary) hypertension: Secondary | ICD-10-CM | POA: Diagnosis not present

## 2016-04-11 DIAGNOSIS — I890 Lymphedema, not elsewhere classified: Secondary | ICD-10-CM | POA: Diagnosis not present

## 2016-04-11 DIAGNOSIS — D649 Anemia, unspecified: Secondary | ICD-10-CM | POA: Diagnosis not present

## 2016-05-02 MED FILL — LOSARTAN-HCTZ 100-12.5 MG T: 100-12.5 | 90 days supply | Qty: 90 | Fill #0

## 2016-05-05 MED FILL — SYNTHROID 112 MCG TABLET: 112 | 30 days supply | Qty: 30 | Fill #0

## 2016-06-06 MED FILL — SYNTHROID 112 MCG TABLET: 112 | 30 days supply | Qty: 30 | Fill #1

## 2016-07-07 MED FILL — SYNTHROID 112 MCG TABLET: 112 | 30 days supply | Qty: 30 | Fill #2

## 2016-07-18 ENCOUNTER — Other Ambulatory Visit (HOSPITAL_COMMUNITY)
Admission: RE | Admit: 2016-07-18 | Discharge: 2016-07-18 | Disposition: A | Discharge status: Home / Self Care | Payer: 59 | Source: Ambulatory Visit | Attending: Obstetrics and Gynecology | Admitting: Obstetrics and Gynecology

## 2016-07-18 ENCOUNTER — Other Ambulatory Visit: Payer: Self-pay | Admitting: Obstetrics and Gynecology

## 2016-07-18 DIAGNOSIS — Z1151 Encounter for screening for human papillomavirus (HPV): Secondary | ICD-10-CM | POA: Diagnosis not present

## 2016-07-18 DIAGNOSIS — D251 Intramural leiomyoma of uterus: Secondary | ICD-10-CM | POA: Diagnosis not present

## 2016-07-18 DIAGNOSIS — Z01419 Encounter for gynecological examination (general) (routine) without abnormal findings: Secondary | ICD-10-CM | POA: Diagnosis not present

## 2016-07-22 LAB — CYTOLOGY - PAP
ADEQUACY: ABSENT
Diagnosis: NEGATIVE
HPV: NOT DETECTED

## 2016-08-05 MED FILL — SYNTHROID 112 MCG TABLET: 112 | 30 days supply | Qty: 30 | Fill #3

## 2016-08-20 MED FILL — LOSARTAN-HCTZ 100-12.5 MG T: 100-12.5 | 90 days supply | Qty: 90 | Fill #1

## 2016-09-04 MED FILL — SYNTHROID 112 MCG TABLET: 112 | 30 days supply | Qty: 30 | Fill #4

## 2016-10-03 MED FILL — SYNTHROID 112 MCG TABLET: 112 | 30 days supply | Qty: 30 | Fill #5

## 2016-10-13 DIAGNOSIS — I10 Essential (primary) hypertension: Secondary | ICD-10-CM | POA: Diagnosis not present

## 2016-10-13 DIAGNOSIS — Z23 Encounter for immunization: Secondary | ICD-10-CM | POA: Diagnosis not present

## 2016-10-13 DIAGNOSIS — E7800 Pure hypercholesterolemia, unspecified: Secondary | ICD-10-CM | POA: Diagnosis not present

## 2016-10-13 DIAGNOSIS — E039 Hypothyroidism, unspecified: Secondary | ICD-10-CM | POA: Diagnosis not present

## 2016-10-21 MED FILL — ATORVASTATIN 10 MG TABLET: 10 | 90 days supply | Qty: 90 | Fill #0

## 2016-11-02 MED FILL — SYNTHROID 112 MCG TABLET: 112 | 30 days supply | Qty: 30 | Fill #6

## 2016-11-27 MED FILL — LOSARTAN-HCTZ 100-12.5 MG T: 100-12.5 | 90 days supply | Qty: 90 | Fill #2

## 2016-11-27 MED FILL — SYNTHROID 112 MCG TABLET: 112 | 30 days supply | Qty: 30 | Fill #7

## 2016-12-30 MED FILL — SYNTHROID 112 MCG TABLET: 112 | 30 days supply | Qty: 30 | Fill #8

## 2017-01-29 MED FILL — SYNTHROID 112 MCG TABLET: 112 | 30 days supply | Qty: 30 | Fill #9

## 2017-02-09 ENCOUNTER — Other Ambulatory Visit: Payer: Self-pay | Admitting: Family Medicine

## 2017-02-09 DIAGNOSIS — Z1231 Encounter for screening mammogram for malignant neoplasm of breast: Secondary | ICD-10-CM

## 2017-03-02 ENCOUNTER — Ambulatory Visit
Admission: RE | Admit: 2017-03-02 | Discharge: 2017-03-02 | Disposition: A | Discharge status: Home / Self Care | Payer: 59 | Source: Ambulatory Visit | Attending: Family Medicine | Admitting: Family Medicine

## 2017-03-02 DIAGNOSIS — Z1231 Encounter for screening mammogram for malignant neoplasm of breast: Secondary | ICD-10-CM | POA: Diagnosis not present

## 2017-03-05 MED FILL — SYNTHROID 112 MCG TABLET: 112 | 30 days supply | Qty: 30 | Fill #10

## 2017-03-05 MED FILL — LOSARTAN-HCTZ 100-12.5 MG T: 100-12.5 | 90 days supply | Qty: 90 | Fill #3

## 2017-03-06 DIAGNOSIS — H524 Presbyopia: Secondary | ICD-10-CM | POA: Diagnosis not present

## 2017-04-06 MED FILL — SYNTHROID 112 MCG TABLET: 112 | 30 days supply | Qty: 30 | Fill #11

## 2017-04-30 DIAGNOSIS — D649 Anemia, unspecified: Secondary | ICD-10-CM | POA: Diagnosis not present

## 2017-04-30 DIAGNOSIS — I890 Lymphedema, not elsewhere classified: Secondary | ICD-10-CM | POA: Diagnosis not present

## 2017-04-30 DIAGNOSIS — E039 Hypothyroidism, unspecified: Secondary | ICD-10-CM | POA: Diagnosis not present

## 2017-04-30 DIAGNOSIS — I10 Essential (primary) hypertension: Secondary | ICD-10-CM | POA: Diagnosis not present

## 2017-04-30 DIAGNOSIS — E7800 Pure hypercholesterolemia, unspecified: Secondary | ICD-10-CM | POA: Diagnosis not present

## 2017-04-30 DIAGNOSIS — Z0000 Encounter for general adult medical examination without abnormal findings: Secondary | ICD-10-CM | POA: Diagnosis not present

## 2017-05-07 MED FILL — SYNTHROID 112 MCG TABLET: 112 | 30 days supply | Qty: 30 | Fill #0

## 2017-06-08 MED FILL — SYNTHROID 112 MCG TABLET: 112 | 30 days supply | Qty: 30 | Fill #1

## 2017-06-17 MED FILL — LOSARTAN-HCTZ 100-12.5 MG T: 100-12.5 | 90 days supply | Qty: 90 | Fill #0

## 2017-07-09 MED FILL — SYNTHROID 112 MCG TABLET: 112 | 30 days supply | Qty: 30 | Fill #2

## 2017-07-23 DIAGNOSIS — D251 Intramural leiomyoma of uterus: Secondary | ICD-10-CM | POA: Diagnosis not present

## 2017-07-23 DIAGNOSIS — N952 Postmenopausal atrophic vaginitis: Secondary | ICD-10-CM | POA: Diagnosis not present

## 2017-07-23 DIAGNOSIS — D229 Melanocytic nevi, unspecified: Secondary | ICD-10-CM | POA: Diagnosis not present

## 2017-07-23 DIAGNOSIS — Z01411 Encounter for gynecological examination (general) (routine) with abnormal findings: Secondary | ICD-10-CM | POA: Diagnosis not present

## 2017-08-10 MED FILL — SYNTHROID 112 MCG TABLET: 112 | 30 days supply | Qty: 30 | Fill #3

## 2017-09-04 MED FILL — SYNTHROID 112 MCG TABLET: 112 | 30 days supply | Qty: 30 | Fill #4

## 2017-09-30 MED FILL — TRIAMCINOLONE 0.1% CREAM: 0.1 | 7 days supply | Qty: 15 | Fill #0

## 2017-10-09 MED FILL — SYNTHROID 112 MCG TABLET: 112 | 30 days supply | Qty: 30 | Fill #5

## 2017-10-09 MED FILL — LOSARTAN-HCTZ 100-12.5 MG T: 100-12.5 | 90 days supply | Qty: 90 | Fill #1

## 2017-11-02 MED FILL — SYNTHROID 112 MCG TABLET: 112 | 30 days supply | Qty: 30 | Fill #6

## 2017-11-04 DIAGNOSIS — L309 Dermatitis, unspecified: Secondary | ICD-10-CM | POA: Diagnosis not present

## 2017-11-04 DIAGNOSIS — L918 Other hypertrophic disorders of the skin: Secondary | ICD-10-CM | POA: Diagnosis not present

## 2017-11-04 MED FILL — FLUTICASONE PROP 0.005% OIN: 0.005 | 7 days supply | Qty: 30 | Fill #0

## 2017-11-09 DIAGNOSIS — I10 Essential (primary) hypertension: Secondary | ICD-10-CM | POA: Diagnosis not present

## 2017-11-09 DIAGNOSIS — E669 Obesity, unspecified: Secondary | ICD-10-CM | POA: Diagnosis not present

## 2017-11-09 DIAGNOSIS — E7800 Pure hypercholesterolemia, unspecified: Secondary | ICD-10-CM | POA: Diagnosis not present

## 2017-11-09 DIAGNOSIS — E039 Hypothyroidism, unspecified: Secondary | ICD-10-CM | POA: Diagnosis not present

## 2017-11-09 DIAGNOSIS — Z6837 Body mass index (BMI) 37.0-37.9, adult: Secondary | ICD-10-CM | POA: Diagnosis not present

## 2017-12-02 DIAGNOSIS — L308 Other specified dermatitis: Secondary | ICD-10-CM | POA: Diagnosis not present

## 2017-12-02 MED FILL — FLUTICASONE PROP 0.005% OIN: 0.005 | 7 days supply | Qty: 30 | Fill #0

## 2017-12-02 MED FILL — BETAMETHASONE DP 0.05% OINT: 0.05 | 7 days supply | Qty: 15 | Fill #0

## 2017-12-03 MED FILL — SYNTHROID 112 MCG TABLET: 112 | 30 days supply | Qty: 30 | Fill #7

## 2017-12-10 ENCOUNTER — Encounter (INDEPENDENT_AMBULATORY_CARE_PROVIDER_SITE_OTHER): Payer: Self-pay

## 2017-12-21 ENCOUNTER — Encounter (INDEPENDENT_AMBULATORY_CARE_PROVIDER_SITE_OTHER): Payer: Self-pay | Admitting: Family Medicine

## 2017-12-21 ENCOUNTER — Ambulatory Visit (INDEPENDENT_AMBULATORY_CARE_PROVIDER_SITE_OTHER): Payer: 59 | Admitting: Family Medicine

## 2017-12-21 VITALS — BP 149/75 | HR 73 | Temp 97.6°F | Ht 62.0 in | Wt 199.0 lb

## 2017-12-21 DIAGNOSIS — E559 Vitamin D deficiency, unspecified: Secondary | ICD-10-CM | POA: Diagnosis not present

## 2017-12-21 DIAGNOSIS — R0602 Shortness of breath: Secondary | ICD-10-CM | POA: Diagnosis not present

## 2017-12-21 DIAGNOSIS — Z9189 Other specified personal risk factors, not elsewhere classified: Secondary | ICD-10-CM

## 2017-12-21 DIAGNOSIS — Z1331 Encounter for screening for depression: Secondary | ICD-10-CM

## 2017-12-21 DIAGNOSIS — D508 Other iron deficiency anemias: Secondary | ICD-10-CM | POA: Diagnosis not present

## 2017-12-21 DIAGNOSIS — R5383 Other fatigue: Secondary | ICD-10-CM

## 2017-12-21 DIAGNOSIS — Z0289 Encounter for other administrative examinations: Secondary | ICD-10-CM

## 2017-12-21 DIAGNOSIS — Z6836 Body mass index (BMI) 36.0-36.9, adult: Secondary | ICD-10-CM

## 2017-12-21 NOTE — Progress Notes (Signed)
.  Office: [phone]  /  Fax: [phone]   HPI:   Chief Complaint: OBESITY  [name] (MR# [id number]) is a 62 y.o. female who presents on 12/21/2017 for obesity evaluation and treatment. Current BMI is Body mass index is 36.4 kg/m.[name] has struggled with obesity for years and has been unsuccessful in either losing weight or maintaining long term weight loss. [name] was told about our clinic by her husband and friend at the [facility]. Draven attended our information session and states she is currently in the action stage of change and ready to dedicate time achieving and maintaining a healthier weight.  [name] states her family eats meals together she thinks her family will eat healthier with  her her desired weight loss is 63 lbs she started gaining weight in 2010 her heaviest weight ever was 203 lbs. she snacks frequently in the evenings she is frequently drinking liquids with calories she frequently makes poor food choices she has binge eating behaviors she struggles with emotional eating    Fatigue [name] feels her energy is lower than it should be. This has worsened with weight gain and has not worsened recently. [name] admits to daytime somnolence and  denies waking up still tired. Patient is at risk for obstructive sleep apnea. Patent has a history of symptoms of daytime fatigue and morning headache. Patient generally gets 6 or 7 hours of sleep per night, and states they generally have restful sleep. Snoring is present. Apneic episodes are not present. Epworth Sleepiness Score is 5 EKG was ordered today and shows PVC's but otherwise normal sinus rhythm.  Dyspnea on exertion [name] notes increasing shortness of breath with exercising and seems to be worsening over time with weight gain. She notes getting out of breath sooner with activity than she used to. This has not gotten worse recently. EKG was ordered today and shows PVC's but otherwise normal sinus rhythm. [name]  denies orthopnea.  Vitamin D deficiency [name] has a diagnosis of vitamin D deficiency. She is currently taking OTC vit D 400 IU daily and denies nausea, vomiting or muscle weakness.  At risk for osteopenia and osteoporosis [name] is at higher risk of osteopenia and osteoporosis due to vitamin D deficiency.   Anemia [name] has a history of anemia.  She is on iron supplementation.  Depression Screen [name]'s Food and Mood (modified PHQ-9) score was  Depression screen PHQ 2/9 12/21/2017  Decreased Interest 2  Down, Depressed, Hopeless 3  PHQ - 2 Score 5  Altered sleeping 0  Tired, decreased energy 1  Change in appetite 3  Feeling bad or failure about yourself  2  Trouble concentrating 2  Moving slowly or fidgety/restless 0  Suicidal thoughts 0  PHQ-9 Score 13  Difficult doing work/chores Not difficult at all    ALLERGIES: Allergies  Allergen Reactions  . Codeine Nausea Only  . Latex Hives and Itching    MEDICATIONS: Current Outpatient Medications on File Prior to Visit  Medication Sig Dispense Refill  . Calcium 600-200 MG-UNIT tablet Take 1 tablet by mouth daily.    . cetirizine (ZYRTEC) 10 MG tablet Take 10 mg by mouth daily.    . diphenhydrAMINE (BENADRYL) 25 mg capsule Take 25 mg by mouth every 6 (six) hours as needed.    . ferrous sulfate 325 (65 FE) MG EC tablet Take 325 mg by mouth 3 (three) times daily with meals.    Alexis Kitchen levothyroxine (SYNTHROID, LEVOTHROID) 112 MCG tablet Take 112 mcg by mouth daily  before breakfast.    . losartan-hydrochlorothiazide (HYZAAR) 100-12.5 MG tablet Take 1 tablet by mouth daily.     No current facility-administered medications on file prior to visit.     PAST MEDICAL HISTORY: Past Medical History:  Diagnosis Date  . Allergy   . Graves disease   . High blood pressure   . Hypothyroid   . Lymphedema     PAST SURGICAL HISTORY: Past Surgical History:  Procedure Laterality Date  . CESAREAN SECTION     x3  . UTERINE FIBROID  EMBOLIZATION  2010    SOCIAL HISTORY: Social History   Tobacco Use  . Smoking status: Never Smoker  . Smokeless tobacco: Never Used  Substance Use Topics  . Alcohol use: Not Currently  . Drug use: Not Currently    FAMILY HISTORY: Family History  Problem Relation Age of Onset  . Thyroid disease Mother   . Obesity Mother     ROS: Review of Systems  Constitutional: Positive for malaise/fatigue.  HENT: Positive for congestion (nasal stuffiness) and sinus pain.        Nasal discharge Hay Fever   Respiratory: Positive for shortness of breath (on exertion).   Cardiovascular: Negative for orthopnea.       Calf/Leg Pain with Walking Leg Cramping Very Cold Feet or Hands  Gastrointestinal: Negative for nausea and vomiting.  Musculoskeletal:       Negative for muscle weakness  Skin: Positive for itching and rash.       Dryness   Neurological: Positive for headaches.    PHYSICAL EXAM: Blood pressure (!) 149/75, pulse 73, temperature 97.6 F (36.4 C), temperature source Oral, height '5\' 2"'$  (1.575 m), weight 199 lb (90.3 kg), SpO2 96 %. Body mass index is 36.4 kg/m. Physical Exam  Constitutional: She is oriented to person, place, and time. She appears well-developed and well-nourished.  HENT:  Head: Normocephalic and atraumatic.  Nose: Nose normal.  Eyes: EOM are normal. No scleral icterus.  Neck: Normal range of motion. Neck supple. No thyromegaly present.  Cardiovascular: Normal rate and regular rhythm.  Pulmonary/Chest: Effort normal. No respiratory distress.  Abdominal: Soft. There is no tenderness.  + obesity  Musculoskeletal: Normal range of motion. She exhibits edema (2+ edema noted in bilateral lower extremities).  Range of Motion normal in all 4 extremities Compression stockings bilateral lower extremities  Neurological: She is alert and oriented to person, place, and time. Coordination normal.  Skin: Skin is warm and dry.  Psychiatric: She has a normal mood  and affect. Her behavior is normal.  Vitals reviewed.   RECENT LABS AND TESTS: BMET    Component Value Date/Time   CREATININE 0.93 11/21/2008 0700   GFRNONAA >60 11/21/2008 0700   GFRAA  11/21/2008 0700    >60        The eGFR has been calculated using the MDRD equation. This calculation has not been validated in all clinical situations. eGFR's persistently <60 mL/min signify possible Chronic Kidney Disease.   No results found for: HGBA1C No results found for: INSULIN CBC    Component Value Date/Time   WBC 4.2 11/21/2008 0700   RBC 4.16 11/21/2008 0700   HGB 10.6 (L) 11/21/2008 0700   HCT 33.2 (L) 11/21/2008 0700   PLT 229 11/21/2008 0700   MCV 79.9 11/21/2008 0700   MCHC 32.0 11/21/2008 0700   RDW 16.9 (H) 11/21/2008 0700   Iron/TIBC/Ferritin/ %Sat No results found for: IRON, TIBC, FERRITIN, IRONPCTSAT Lipid Panel  No results found for:  CHOL, TRIG, HDL, CHOLHDL, VLDL, LDLCALC, LDLDIRECT Hepatic Function Panel  No results found for: PROT, ALBUMIN, AST, ALT, ALKPHOS, BILITOT, BILIDIR, IBILI No results found for: TSH Vitamin D No recent lab results  ECG  shows NSR with a rate of 71 BPM INDIRECT CALORIMETER done today shows a VO2 of 203 and a REE of 1411. Her calculated basal metabolic rate is 7209 thus her basal metabolic rate is worse than expected.    ASSESSMENT AND PLAN: Other fatigue - Plan: EKG 12-Lead, Comprehensive metabolic panel, CBC With Differential, Hemoglobin A1c, Insulin, random, Lipid Panel With LDL/HDL Ratio, Vitamin B12, Folate, T3, TSH, T4, free  Shortness of breath on exertion  Vitamin D deficiency - Plan: VITAMIN D 25 Hydroxy (Vit-D Deficiency, Fractures)  Other iron deficiency anemia - Plan: Anemia panel  Depression screening  At risk for osteoporosis  Class 2 severe obesity with serious comorbidity and body mass index (BMI) of 36.0 to 36.9 in adult, unspecified obesity type (HCC)  PLAN:  Fatigue Micheline was informed that her  fatigue may be related to obesity, depression or many other causes. Labs will be ordered, and in the meanwhile Dede has agreed to work on diet, exercise and weight loss to help with fatigue. Proper sleep hygiene was discussed including the need for 7-8 hours of quality sleep each night. A sleep study was not ordered based on symptoms and Epworth score. We will order indirect calorimetry.  Dyspnea on exertion Mikah's shortness of breath appears to be obesity related and exercise induced. She has agreed to work on weight loss and gradually increase exercise to treat her exercise induced shortness of breath. If Elsye follows our instructions and loses weight without improvement of her shortness of breath, we will plan to refer to pulmonology. We will order labs and indirect calorimetry. We will monitor this condition regularly. Edda agrees to this plan.  Vitamin D Deficiency Abbigayle was informed that low vitamin D levels contributes to fatigue and are associated with obesity, breast, and colon cancer. She agrees to continue to take OTC vitamin D and we will check vitamin D level today. Itali will follow up for routine testing of vitamin D, at least 2-3 times per year. She was informed of the risk of over-replacement of vitamin D and agrees to not increase her dose unless she discusses this with [place] first.  At risk for osteopenia and osteoporosis Vanetta is at risk for osteopenia and osteoporosis due to her vitamin D deficiency. She was encouraged to take her vitamin D and follow her higher calcium diet and increase strengthening exercise to help strengthen her bones and decrease her risk of osteopenia and osteoporosis.  Anemia The diagnosis of anemia was discussed with [name] and was explained in detail. She was given suggestions of iron rich foods and iron supplement was not prescribed. We will check anemia panel and Marlita agreed to follow up as directed.  Depression Screen Charmane had a moderately  positive depression screening. Depression is commonly associated with obesity and often results in emotional eating behaviors. We will monitor this closely and work on CBT to help improve the non-hunger eating patterns. Referral to Psychology may be required if no improvement is seen as she continues in our clinic.  Obesity Muskan is currently in the action stage of change and her goal is to continue with weight loss efforts She has agreed to follow the Category 2 plan Lenetta has been instructed to work up to a goal of 150 minutes of combined cardio  and strengthening exercise per week for weight loss and overall health benefits. We discussed the following Behavioral Modification Strategies today: planning for success, increasing lean protein intake, increasing vegetables, work on meal planning and easy cooking plans and dealing with family or coworker sabotage  Delina has agreed to follow up with our clinic in 2 weeks. She was informed of the importance of frequent follow up visits to maximize her success with intensive lifestyle modifications for her multiple health conditions. She was informed we would discuss her lab results at her next visit unless there is a critical issue that needs to be addressed sooner. Savhanna agreed to keep her next visit at the agreed upon time to discuss these results.    OBESITY BEHAVIORAL INTERVENTION VISIT  Today's visit was # 1 out of 22.  Starting weight: 199 lbs Starting date: 12/21/17 Today's weight : 199 lbs  Today's date: 12/21/2017 Total lbs lost to date: 0 (Patients must lose 7 lbs in the first 6 months to continue with counseling)   ASK: We discussed the diagnosis of obesity with Ardelle Balls today and Fallyn agreed to give [place] permission to discuss obesity behavioral modification therapy today.  ASSESS: Lindee has the diagnosis of obesity and her BMI today is 36.39 Mardell is in the action stage of change   ADVISE: Avion was educated on the  multiple health risks of obesity as well as the benefit of weight loss to improve her health. She was advised of the need for long term treatment and the importance of lifestyle modifications.  AGREE: Multiple dietary modification options and treatment options were discussed and  Editha agreed to the above obesity treatment plan.   I, Doreene Nest, am acting as transcriptionist for Eber Jones, MD    I have reviewed the above documentation for accuracy and completeness, and I agree with the above. - Ilene Qua, MD

## 2017-12-22 LAB — LIPID PANEL WITH LDL/HDL RATIO
CHOLESTEROL TOTAL: 207 mg/dL — AB (ref 100–199)
HDL: 53 mg/dL (ref >39)
LDL CALC: 138 mg/dL — AB (ref 0–99)
LDl/HDL Ratio: 2.6 ratio (ref 0.0–3.2)
Triglycerides: 78 mg/dL (ref 0–149)
VLDL Cholesterol Cal: 16 mg/dL (ref 5–40)

## 2017-12-22 LAB — ANEMIA PANEL
Ferritin: 194 ng/mL — ABNORMAL HIGH (ref 15–150)
Folate, Hemolysate: 332 ng/mL
Folate, RBC: 874 ng/mL (ref >498)
Hematocrit: 38 % (ref 34.0–46.6)
Iron Saturation: 17 % (ref 15–55)
Iron: 50 ug/dL (ref 27–159)
Retic Ct Pct: 1.2 % (ref 0.6–2.6)
Total Iron Binding Capacity: 297 ug/dL (ref 250–450)
UIBC: 247 ug/dL (ref 131–425)
Vitamin B-12: 504 pg/mL (ref 232–1245)

## 2017-12-22 LAB — COMPREHENSIVE METABOLIC PANEL WITH GFR
ALT: 16 IU/L (ref 0–32)
AST: 18 IU/L (ref 0–40)
Albumin/Globulin Ratio: 1.5 (ref 1.2–2.2)
Albumin: 4.1 g/dL (ref 3.5–5.5)
Alkaline Phosphatase: 86 IU/L (ref 39–117)
BUN/Creatinine Ratio: 14 (ref 9–23)
BUN: 14 mg/dL (ref 6–24)
Bilirubin Total: 0.2 mg/dL (ref 0.0–1.2)
CO2: 26 mmol/L (ref 20–29)
Calcium: 9.1 mg/dL (ref 8.7–10.2)
Chloride: 101 mmol/L (ref 96–106)
Creatinine, Ser: 0.99 mg/dL (ref 0.57–1.00)
GFR calc Af Amer: 72 mL/min/1.73 (ref >59)
GFR calc non Af Amer: 63 mL/min/1.73 (ref >59)
Globulin, Total: 2.8 g/dL (ref 1.5–4.5)
Glucose: 93 mg/dL (ref 65–99)
Potassium: 4.3 mmol/L (ref 3.5–5.2)
Sodium: 141 mmol/L (ref 134–144)
Total Protein: 6.9 g/dL (ref 6.0–8.5)

## 2017-12-22 LAB — CBC WITH DIFFERENTIAL
Basophils Absolute: 0 x10E3/uL (ref 0.0–0.2)
Basos: 1 %
EOS (ABSOLUTE): 0.2 x10E3/uL (ref 0.0–0.4)
Eos: 4 %
Hemoglobin: 13 g/dL (ref 11.1–15.9)
Immature Grans (Abs): 0 x10E3/uL (ref 0.0–0.1)
Immature Granulocytes: 0 %
Lymphocytes Absolute: 1.6 x10E3/uL (ref 0.7–3.1)
Lymphs: 40 %
MCH: 29.6 pg (ref 26.6–33.0)
MCHC: 34.2 g/dL (ref 31.5–35.7)
MCV: 87 fL (ref 79–97)
Monocytes Absolute: 0.2 x10E3/uL (ref 0.1–0.9)
Monocytes: 6 %
Neutrophils Absolute: 1.9 x10E3/uL (ref 1.4–7.0)
Neutrophils: 49 %
RBC: 4.39 x10E6/uL (ref 3.77–5.28)
RDW: 13.3 % (ref 12.3–15.4)
WBC: 3.9 x10E3/uL (ref 3.4–10.8)

## 2017-12-22 LAB — VITAMIN D 25 HYDROXY (VIT D DEFICIENCY, FRACTURES): Vit D, 25-Hydroxy: 39.4 ng/mL (ref 30.0–100.0)

## 2017-12-22 LAB — HEMOGLOBIN A1C
Est. average glucose Bld gHb Est-mCnc: 126 mg/dL
HEMOGLOBIN A1C: 6 % — AB (ref 4.8–5.6)

## 2017-12-22 LAB — T3: T3 TOTAL: 70 ng/dL — AB (ref 71–180)

## 2017-12-22 LAB — T4, FREE: Free T4: 1.35 ng/dL (ref 0.82–1.77)

## 2017-12-22 LAB — INSULIN, RANDOM: INSULIN: 10.9 u[IU]/mL (ref 2.6–24.9)

## 2017-12-22 LAB — FOLATE: Folate: 11.2 ng/mL (ref >3.0)

## 2017-12-22 LAB — TSH: TSH: 0.592 u[IU]/mL (ref 0.450–4.500)

## 2017-12-29 DIAGNOSIS — L0390 Cellulitis, unspecified: Secondary | ICD-10-CM | POA: Diagnosis not present

## 2017-12-29 MED FILL — CEPHALEXIN 500 MG CAPSULE: 500 | 10 days supply | Qty: 20 | Fill #0

## 2018-01-04 ENCOUNTER — Ambulatory Visit (INDEPENDENT_AMBULATORY_CARE_PROVIDER_SITE_OTHER): Payer: 59 | Admitting: Family Medicine

## 2018-01-04 VITALS — BP 122/78 | HR 62 | Temp 97.8°F | Ht 62.0 in | Wt 197.0 lb

## 2018-01-04 DIAGNOSIS — R7303 Prediabetes: Secondary | ICD-10-CM | POA: Diagnosis not present

## 2018-01-04 DIAGNOSIS — Z9189 Other specified personal risk factors, not elsewhere classified: Secondary | ICD-10-CM

## 2018-01-04 DIAGNOSIS — E7849 Other hyperlipidemia: Secondary | ICD-10-CM | POA: Diagnosis not present

## 2018-01-04 DIAGNOSIS — Z6836 Body mass index (BMI) 36.0-36.9, adult: Secondary | ICD-10-CM | POA: Diagnosis not present

## 2018-01-04 DIAGNOSIS — E559 Vitamin D deficiency, unspecified: Secondary | ICD-10-CM | POA: Diagnosis not present

## 2018-01-04 MED FILL — SYNTHROID 112 MCG TABLET: 112 | 30 days supply | Qty: 30 | Fill #8

## 2018-01-07 DIAGNOSIS — L820 Inflamed seborrheic keratosis: Secondary | ICD-10-CM | POA: Diagnosis not present

## 2018-01-07 NOTE — Progress Notes (Signed)
Office: [phone]  /  Fax: [phone]   HPI:   Chief Complaint: OBESITY [name] is here to discuss her progress with her obesity treatment plan. She is on the Category 2 plan and is following her eating plan approximately 50 % of the time. She states she is walking and riding bike for 30-60 minutes 3-5 times per week. [name] did really well the first week but indulged on Easter and Monday (1 week ago). Only eating approximately 2-4 oz of meat at dinner and only doing 2 oz of meat on a sandwich.  Her weight is 197 lb (89.4 kg) today and has had a weight loss of 2 pounds over a period of 2 weeks since her last visit. She has lost 2 lbs since starting treatment with [place].  Hyperlipidemia [name] has hyperlipidemia and has been trying to improve her cholesterol levels with intensive lifestyle modification including a low saturated fat diet, exercise and weight loss. LDL of 138.  She denies any chest pain, claudication or myalgias.  Vitamin D Deficiency [name] has a diagnosis of vitamin D deficiency. She is on Vit D supplement and denies nausea, vomiting or muscle weakness.  Pre-Diabetes [name] has a diagnosis of pre-diabetes based on her elevated Hgb A1c and was informed this puts her at greater risk of developing diabetes. Hgb A1c of 6.0, no prior diagnosis. She is not taking metformin currently and continues to work on diet and exercise to decrease risk of diabetes. She denies nausea or hypoglycemia.  At risk for diabetes [name] is at higher than average risk for developing diabetes due to her obesity and pre-diabetes. She currently denies polyuria or polydipsia.  ALLERGIES: Allergies  Allergen Reactions  . Codeine Nausea Only  . Latex Hives and Itching    MEDICATIONS: Current Outpatient Medications on File Prior to Visit  Medication Sig Dispense Refill  . Calcium 600-200 MG-UNIT tablet Take 1 tablet by mouth daily.    . cetirizine (ZYRTEC) 10 MG tablet Take 10 mg by mouth daily.     . diphenhydrAMINE (BENADRYL) 25 mg capsule Take 25 mg by mouth every 6 (six) hours as needed.    . ferrous sulfate 325 (65 FE) MG EC tablet Take 325 mg by mouth 3 (three) times daily with meals.    [name] levothyroxine (SYNTHROID, LEVOTHROID) 112 MCG tablet Take 112 mcg by mouth daily before breakfast.    . losartan-hydrochlorothiazide (HYZAAR) 100-12.5 MG tablet Take 1 tablet by mouth daily.     No current facility-administered medications on file prior to visit.     PAST MEDICAL HISTORY: Past Medical History:  Diagnosis Date  . Allergy   . Graves disease   . High blood pressure   . Hypothyroid   . Lymphedema     PAST SURGICAL HISTORY: Past Surgical History:  Procedure Laterality Date  . CESAREAN SECTION     x3  . UTERINE FIBROID EMBOLIZATION  2010    SOCIAL HISTORY: Social History   Tobacco Use  . Smoking status: Never Smoker  . Smokeless tobacco: Never Used  Substance Use Topics  . Alcohol use: Not Currently  . Drug use: Not Currently    FAMILY HISTORY: Family History  Problem Relation Age of Onset  . Thyroid disease Mother   . Obesity Mother     ROS: Review of Systems  Constitutional: Positive for weight loss.  Cardiovascular: Negative for chest pain and claudication.  Gastrointestinal: Negative for nausea and vomiting.  Genitourinary: Negative for frequency.  Musculoskeletal: Negative for  myalgias.       Negative muscle weakness  Endo/Heme/Allergies: Negative for polydipsia.       Negative hypoglycemia    PHYSICAL EXAM: Blood pressure 122/78, pulse 62, temperature 97.8 F (36.6 C), height 5\' 2"  (1.575 m), weight 197 lb (89.4 kg), SpO2 100 %. Body mass index is 36.03 kg/m. Physical Exam  Constitutional: She is oriented to person, place, and time. She appears well-developed and well-nourished.  Cardiovascular: Normal rate.  Pulmonary/Chest: Effort normal.  Musculoskeletal: Normal range of motion.  Neurological: She is oriented to person, place, and  time.  Skin: Skin is warm and dry.  Psychiatric: She has a normal mood and affect. Her behavior is normal.  Vitals reviewed.   RECENT LABS AND TESTS: BMET    Component Value Date/Time   NA 141 12/21/2017 1131   K 4.3 12/21/2017 1131   CL 101 12/21/2017 1131   CO2 26 12/21/2017 1131   GLUCOSE 93 12/21/2017 1131   BUN 14 12/21/2017 1131   CREATININE 0.99 12/21/2017 1131   CALCIUM 9.1 12/21/2017 1131   GFRNONAA 63 12/21/2017 1131   GFRAA 72 12/21/2017 1131   Lab Results  Component Value Date   HGBA1C 6.0 (H) 12/21/2017   Lab Results  Component Value Date   INSULIN 10.9 12/21/2017   CBC    Component Value Date/Time   WBC 3.9 12/21/2017 1131   WBC 4.2 11/21/2008 0700   RBC 4.39 12/21/2017 1131   RBC 4.16 11/21/2008 0700   HGB 13.0 12/21/2017 1131   HCT 38.0 12/21/2017 1131   PLT 229 11/21/2008 0700   MCV 87 12/21/2017 1131   MCH 29.6 12/21/2017 1131   MCHC 34.2 12/21/2017 1131   MCHC 32.0 11/21/2008 0700   RDW 13.3 12/21/2017 1131   LYMPHSABS 1.6 12/21/2017 1131   EOSABS 0.2 12/21/2017 1131   BASOSABS 0.0 12/21/2017 1131   Iron/TIBC/Ferritin/ %Sat    Component Value Date/Time   IRON 50 12/21/2017 1131   TIBC 297 12/21/2017 1131   FERRITIN 194 (H) 12/21/2017 1131   IRONPCTSAT 17 12/21/2017 1131   Lipid Panel     Component Value Date/Time   CHOL 207 (H) 12/21/2017 1131   TRIG 78 12/21/2017 1131   HDL 53 12/21/2017 1131   LDLCALC 138 (H) 12/21/2017 1131   Hepatic Function Panel     Component Value Date/Time   PROT 6.9 12/21/2017 1131   ALBUMIN 4.1 12/21/2017 1131   AST 18 12/21/2017 1131   ALT 16 12/21/2017 1131   ALKPHOS 86 12/21/2017 1131   BILITOT 0.2 12/21/2017 1131      Component Value Date/Time   TSH 0.592 12/21/2017 1131  Results for KHALILAH, HOKE (MRN [id number]) as of 01/07/2018 08:33  Ref. Range 12/21/2017 11:31  Vitamin D, 25-Hydroxy Latest Ref Range: 30.0 - 100.0 ng/mL 39.4    ASSESSMENT AND PLAN: Other hyperlipidemia  Vitamin D  deficiency  Prediabetes  At risk for diabetes mellitus  Class 2 severe obesity with serious comorbidity and body mass index (BMI) of 36.0 to 36.9 in adult, unspecified obesity type ([name])  PLAN:  Hyperlipidemia [name] was informed of the American Heart Association Guidelines emphasizing intensive lifestyle modifications as the first line treatment for hyperlipidemia. We discussed many lifestyle modifications today in depth, and [name] will continue to work on decreasing saturated fats such as fatty red meat, butter and many fried foods. She will also increase vegetables and lean protein in her diet and continue to work on exercise and weight loss  efforts. We will recheck labs in 3 months and [name] agrees to follow up with our clinic in 2 weeks.  Vitamin D Deficiency [name] was informed that low vitamin D levels contributes to fatigue and are associated with obesity, breast, and colon cancer. [name] will continue current supplement; she is to follow up with dose of Vit D at next visit and will follow up for routine testing of vitamin D, at least 2-3 times per year. She was informed of the risk of over-replacement of vitamin D and agrees to not increase her dose unless she discusses this with [place] first. [name] agrees to follow up with our clinic in 2 weeks.  Pre-Diabetes [name] will continue to work on weight loss, exercise, and decreasing simple carbohydrates in her diet to help decrease the risk of diabetes. We dicussed metformin including benefits and risks. She was informed that eating too many simple carbohydrates or too many calories at one sitting increases the likelihood of GI side effects. [name] refuses metformin at this time and a prescription was not written today. She will continue Category 2 and we will recheck labs in 3 months. [name] agrees to follow up with our clinic in 2 weeks as directed to monitor her progress.  Diabetes risk counselling [name] was given extended (30 minutes)  diabetes prevention counseling today. She is 62 y.o. female and has risk factors for diabetes including obesity and pre-diabetes. We discussed intensive lifestyle modifications today with an emphasis on weight loss as well as increasing exercise and decreasing simple carbohydrates in her diet.  Obesity [name] is currently in the action stage of change. As such, her goal is to continue with weight loss efforts She has agreed to follow the Category 2 plan [name] has been instructed to work up to a goal of 150 minutes of combined cardio and strengthening exercise per week for weight loss and overall health benefits. We discussed the following Behavioral Modification Strategies today: increasing lean protein intake, increasing vegetables, work on meal planning and easy cooking plans, better snacking choices, celebration eating strategies, and planning for success   [name] has agreed to follow up with our clinic in 2 weeks. She was informed of the importance of frequent follow up visits to maximize her success with intensive lifestyle modifications for her multiple health conditions.   OBESITY BEHAVIORAL INTERVENTION VISIT  Today's visit was # 2 out of 22.  Starting weight: 199 lbs Starting date: 12/21/17 Today's weight : 197 lbs  Today's date: 01/04/2018 Total lbs lost to date: 2 (Patients must lose 7 lbs in the first 6 months to continue with counseling)   ASK: We discussed the diagnosis of obesity with Ardelle Balls today and Kamila agreed to give [place] permission to discuss obesity behavioral modification therapy today.  ASSESS: Andera has the diagnosis of obesity and her BMI today is 36.02 Shelbi is in the action stage of change   ADVISE: Kriston was educated on the multiple health risks of obesity as well as the benefit of weight loss to improve her health. She was advised of the need for long term treatment and the importance of lifestyle modifications.  AGREE: Multiple dietary  modification options and treatment options were discussed and  Malayzia agreed to the above obesity treatment plan.  I, Trixie Dredge, am acting as transcriptionist for Ilene Qua, MD  I have reviewed the above documentation for accuracy and completeness, and I agree with the above. - Ilene Qua, MD

## 2018-01-14 MED FILL — BETAMETHASONE DP 0.05% OINT: 0.05 | 15 days supply | Qty: 15 | Fill #0

## 2018-01-20 ENCOUNTER — Ambulatory Visit (INDEPENDENT_AMBULATORY_CARE_PROVIDER_SITE_OTHER): Payer: 59 | Admitting: Family Medicine

## 2018-01-20 VITALS — BP 107/68 | HR 81 | Temp 97.9°F | Ht 62.0 in | Wt 193.0 lb

## 2018-01-20 DIAGNOSIS — E038 Other specified hypothyroidism: Secondary | ICD-10-CM | POA: Diagnosis not present

## 2018-01-20 DIAGNOSIS — I10 Essential (primary) hypertension: Secondary | ICD-10-CM

## 2018-01-20 DIAGNOSIS — Z6835 Body mass index (BMI) 35.0-35.9, adult: Secondary | ICD-10-CM | POA: Diagnosis not present

## 2018-01-20 NOTE — Progress Notes (Signed)
Office: [phone]  /  Fax: [phone]   HPI:   Chief Complaint: OBESITY [name] is here to discuss her progress with her obesity treatment plan. She is on the Category 2 plan and is following her eating plan approximately 40 % of the time. She states she is doing cardio and walking for 60 to 90 minutes. [name] out yesterday for her birthday. She tried to be mindful of her food choices. [name] had no significant hunger. She is occasionally indulging in sweets. Her weight is 193 lb (87.5 kg) today and has had a weight loss of 4 pounds over a period of 2 weeks since her last visit. She has lost 6 lbs since starting treatment with [place].  Hypertension [name] is a 62 y.o. female with hypertension. Her blood pressure is slightly lower today at 107/68. [name] denies dizziness or lightheadedness. She is working weight loss to help control her blood pressure with the goal of decreasing her risk of heart attack and stroke. [name] blood pressure is not currently controlled.  Hypothyroidism [name] has a diagnosis of hypothyroidism. She is on levothyroxine. She denies hot or cold intolerance or palpitations, but does admit to ongoing fatigue.  ALLERGIES: Allergies  Allergen Reactions  . Codeine Nausea Only  . Latex Hives and Itching    MEDICATIONS: Current Outpatient Medications on File Prior to Visit  Medication Sig Dispense Refill  . Calcium 600-200 MG-UNIT tablet Take 1 tablet by mouth daily.    . cetirizine (ZYRTEC) 10 MG tablet Take 10 mg by mouth daily.    . diphenhydrAMINE (BENADRYL) 25 mg capsule Take 25 mg by mouth every 6 (six) hours as needed.    . ferrous sulfate 325 (65 FE) MG EC tablet Take 325 mg by mouth 3 (three) times daily with meals.    [name] levothyroxine (SYNTHROID, LEVOTHROID) 112 MCG tablet Take 112 mcg by mouth daily before breakfast.    . losartan-hydrochlorothiazide (HYZAAR) 100-12.5 MG tablet Take 1 tablet by mouth daily.     No current  facility-administered medications on file prior to visit.     PAST MEDICAL HISTORY: Past Medical History:  Diagnosis Date  . Allergy   . Graves disease   . High blood pressure   . Hypothyroid   . Lymphedema     PAST SURGICAL HISTORY: Past Surgical History:  Procedure Laterality Date  . CESAREAN SECTION     x3  . UTERINE FIBROID EMBOLIZATION  2010    SOCIAL HISTORY: Social History   Tobacco Use  . Smoking status: Never Smoker  . Smokeless tobacco: Never Used  Substance Use Topics  . Alcohol use: Not Currently  . Drug use: Not Currently    FAMILY HISTORY: Family History  Problem Relation Age of Onset  . Thyroid disease Mother   . Obesity Mother     ROS: Review of Systems  Constitutional: Positive for malaise/fatigue and weight loss.  Cardiovascular: Negative for palpitations.  Neurological: Negative for dizziness.       Negative for lightheadedness  Endo/Heme/Allergies:       Negative for heat or cold intolerance    PHYSICAL EXAM: Blood pressure 107/68, pulse 81, temperature 97.9 F (36.6 C), temperature source Oral, height 5\' 2"  (1.575 m), weight 193 lb (87.5 kg), SpO2 99 %. Body mass index is 35.3 kg/m. Physical Exam  Constitutional: She is oriented to person, place, and time. She appears well-developed and well-nourished.  Cardiovascular: Normal rate.  Pulmonary/Chest: Effort normal.  Musculoskeletal:  Normal range of motion.  Neurological: She is oriented to person, place, and time.  Skin: Skin is warm and dry.  Psychiatric: She has a normal mood and affect. Her behavior is normal.  Vitals reviewed.   RECENT LABS AND TESTS: BMET    Component Value Date/Time   NA 141 12/21/2017 1131   K 4.3 12/21/2017 1131   CL 101 12/21/2017 1131   CO2 26 12/21/2017 1131   GLUCOSE 93 12/21/2017 1131   BUN 14 12/21/2017 1131   CREATININE 0.99 12/21/2017 1131   CALCIUM 9.1 12/21/2017 1131   GFRNONAA 63 12/21/2017 1131   GFRAA 72 12/21/2017 1131   Lab  Results  Component Value Date   HGBA1C 6.0 (H) 12/21/2017   Lab Results  Component Value Date   INSULIN 10.9 12/21/2017   CBC    Component Value Date/Time   WBC 3.9 12/21/2017 1131   WBC 4.2 11/21/2008 0700   RBC 4.39 12/21/2017 1131   RBC 4.16 11/21/2008 0700   HGB 13.0 12/21/2017 1131   HCT 38.0 12/21/2017 1131   PLT 229 11/21/2008 0700   MCV 87 12/21/2017 1131   MCH 29.6 12/21/2017 1131   MCHC 34.2 12/21/2017 1131   MCHC 32.0 11/21/2008 0700   RDW 13.3 12/21/2017 1131   LYMPHSABS 1.6 12/21/2017 1131   EOSABS 0.2 12/21/2017 1131   BASOSABS 0.0 12/21/2017 1131   Iron/TIBC/Ferritin/ %Sat    Component Value Date/Time   IRON 50 12/21/2017 1131   TIBC 297 12/21/2017 1131   FERRITIN 194 (H) 12/21/2017 1131   IRONPCTSAT 17 12/21/2017 1131   Lipid Panel     Component Value Date/Time   CHOL 207 (H) 12/21/2017 1131   TRIG 78 12/21/2017 1131   HDL 53 12/21/2017 1131   LDLCALC 138 (H) 12/21/2017 1131   Hepatic Function Panel     Component Value Date/Time   PROT 6.9 12/21/2017 1131   ALBUMIN 4.1 12/21/2017 1131   AST 18 12/21/2017 1131   ALT 16 12/21/2017 1131   ALKPHOS 86 12/21/2017 1131   BILITOT 0.2 12/21/2017 1131      Component Value Date/Time   TSH 0.592 12/21/2017 1131   Results for Alexis, Navarro (MRN [id number]) as of 01/20/2018 17:44  Ref. Range 12/21/2017 11:31  Vitamin D, 25-Hydroxy Latest Ref Range: 30.0 - 100.0 ng/mL 39.4   ASSESSMENT AND PLAN: Essential hypertension  Other specified hypothyroidism  Class 2 severe obesity with serious comorbidity and body mass index (BMI) of 35.0 to 35.9 in adult, unspecified obesity type ([place])  PLAN:  Hypertension We discussed sodium restriction, working on healthy weight loss, and a regular exercise program as the means to achieve improved blood pressure control. [name] agreed with this plan and agreed to follow up as directed. We will continue to monitor her blood pressure as well as her progress with the  above lifestyle modifications. She agrees to take her blood pressure 2 to 3 times per week and MyChart if systolic is <616.   Hypothyroidism [name] was informed of the importance of good thyroid control to help with weight loss efforts. She was also informed that supertheraputic thyroid levels are dangerous and will not improve weight loss results. Baila will continue levothyroxine at the current dose and follow up as directed.  We spent > than 50% of the 15 minute visit on the counseling as documented in the note.  Obesity [name] is currently in the action stage of change. As such, her goal is to continue with weight  loss efforts She has agreed to follow the Category 2 plan [name] has been instructed to work up to a goal of 150 minutes of combined cardio and strengthening exercise per week for weight loss and overall health benefits. We discussed the following Behavioral Modification Strategies today: planning for success, increasing lean protein intake, work on meal planning and easy cooking plans and avoiding temptations  [name] has agreed to follow up with our clinic in 2 weeks. She was informed of the importance of frequent follow up visits to maximize her success with intensive lifestyle modifications for her multiple health conditions.   OBESITY BEHAVIORAL INTERVENTION VISIT  Today's visit was # 3 out of 22.  Starting weight: 199 lbs Starting date: 12/21/17 Today's weight : 193 lbs  Today's date: 01/20/2018 Total lbs lost to date: 6 (Patients must lose 7 lbs in the first 6 months to continue with counseling)   ASK: We discussed the diagnosis of obesity with [name] today and [name] agreed to give [place] permission to discuss obesity behavioral modification therapy today.  ASSESS: [name] has the diagnosis of obesity and her BMI today is 35.29 [place] is in the action stage of change   ADVISE: [name] was educated on the multiple health risks of obesity as well as the  benefit of weight loss to improve her health. She was advised of the need for long term treatment and the importance of lifestyle modifications.  AGREE: Multiple dietary modification options and treatment options were discussed and  Saavi agreed to the above obesity treatment plan.  I, Doreene Nest, am acting as transcriptionist for Eber Jones, MD  I have reviewed the above documentation for accuracy and completeness, and I agree with the above. - Ilene Qua, MD

## 2018-01-27 MED FILL — FLUTICASONE PROP 0.005% OIN: 0.005 | 7 days supply | Qty: 30 | Fill #1

## 2018-02-04 MED FILL — SYNTHROID 112 MCG TABLET: 112 | 30 days supply | Qty: 30 | Fill #0

## 2018-02-10 ENCOUNTER — Ambulatory Visit (INDEPENDENT_AMBULATORY_CARE_PROVIDER_SITE_OTHER): Payer: 59 | Admitting: Family Medicine

## 2018-02-10 VITALS — BP 145/87 | HR 85 | Temp 97.7°F | Ht 62.0 in | Wt 195.0 lb

## 2018-02-10 DIAGNOSIS — Z6835 Body mass index (BMI) 35.0-35.9, adult: Secondary | ICD-10-CM

## 2018-02-10 DIAGNOSIS — I10 Essential (primary) hypertension: Secondary | ICD-10-CM

## 2018-02-10 DIAGNOSIS — E038 Other specified hypothyroidism: Secondary | ICD-10-CM | POA: Diagnosis not present

## 2018-02-10 NOTE — Progress Notes (Signed)
Office: [phone]  /  Fax: [phone]   HPI:   Chief Complaint: OBESITY Clydean is here to discuss her progress with her obesity treatment plan. She is on the Category 2 plan and is following her eating plan approximately 50 % of the time. She states she is walking and doing cardio for 60 to 90 minutes 5 times per week. Anushri has had some significant family stressors, so she has indulged on snacks, such as chocolate and popsicle. Her weight is 195 lb (88.5 kg) today and has had a weight gain of 2 pounds over a period of 3 weeks since her last visit. She has lost 4 lbs since starting treatment with [place].  Hypertension VINCENTA STEFFEY is a 62 y.o. female with hypertension.  Ardelle Balls denies chest pain, chest pressure or headache. She is working weight loss to help control her blood pressure with the goal of decreasing her risk of heart attack and stroke. Sherrys blood pressure is controlled today.  Hypothyroid Samya has a diagnosis of hypothyroidism. She is on levothyroxine. She denies hot or cold intolerance or palpitations.  ALLERGIES: Allergies  Allergen Reactions  . Codeine Nausea Only  . Latex Hives and Itching    MEDICATIONS: Current Outpatient Medications on File Prior to Visit  Medication Sig Dispense Refill  . Calcium 600-200 MG-UNIT tablet Take 1 tablet by mouth daily.    . cetirizine (ZYRTEC) 10 MG tablet Take 10 mg by mouth daily.    . diphenhydrAMINE (BENADRYL) 25 mg capsule Take 25 mg by mouth every 6 (six) hours as needed.    . ferrous sulfate 325 (65 FE) MG EC tablet Take 325 mg by mouth 3 (three) times daily with meals.    [name] levothyroxine (SYNTHROID, LEVOTHROID) 112 MCG tablet Take 112 mcg by mouth daily before breakfast.    . losartan-hydrochlorothiazide (HYZAAR) 100-12.5 MG tablet Take 1 tablet by mouth daily.     No current facility-administered medications on file prior to visit.     PAST MEDICAL HISTORY: Past Medical History:  Diagnosis Date  .  Allergy   . Graves disease   . High blood pressure   . Hypothyroid   . Lymphedema     PAST SURGICAL HISTORY: Past Surgical History:  Procedure Laterality Date  . CESAREAN SECTION     x3  . UTERINE FIBROID EMBOLIZATION  2010    SOCIAL HISTORY: Social History   Tobacco Use  . Smoking status: Never Smoker  . Smokeless tobacco: Never Used  Substance Use Topics  . Alcohol use: Not Currently  . Drug use: Not Currently    FAMILY HISTORY: Family History  Problem Relation Age of Onset  . Thyroid disease Mother   . Obesity Mother     ROS: Review of Systems  Constitutional: Negative for weight loss.  Cardiovascular: Negative for chest pain and palpitations.       Negative for chest pressure  Neurological: Negative for headaches.  Endo/Heme/Allergies:       Negative for heat or cold intolerance    PHYSICAL EXAM: Blood pressure (!) 145/87, pulse 85, temperature 97.7 F (36.5 C), temperature source Oral, height 5\' 2"  (1.575 m), weight 195 lb (88.5 kg), SpO2 99 %. Body mass index is 35.67 kg/m. Physical Exam  Constitutional: She is oriented to person, place, and time. She appears well-developed and well-nourished.  Cardiovascular: Normal rate.  Pulmonary/Chest: Effort normal.  Musculoskeletal: Normal range of motion.  Neurological: She is oriented to person, place, and time.  Skin: Skin is warm and dry.  Psychiatric: She has a normal mood and affect. Her behavior is normal.  Vitals reviewed.   RECENT LABS AND TESTS: BMET    Component Value Date/Time   NA 141 12/21/2017 1131   K 4.3 12/21/2017 1131   CL 101 12/21/2017 1131   CO2 26 12/21/2017 1131   GLUCOSE 93 12/21/2017 1131   BUN 14 12/21/2017 1131   CREATININE 0.99 12/21/2017 1131   CALCIUM 9.1 12/21/2017 1131   GFRNONAA 63 12/21/2017 1131   GFRAA 72 12/21/2017 1131   Lab Results  Component Value Date   HGBA1C 6.0 (H) 12/21/2017   Lab Results  Component Value Date   INSULIN 10.9 12/21/2017   CBC     Component Value Date/Time   WBC 3.9 12/21/2017 1131   WBC 4.2 11/21/2008 0700   RBC 4.39 12/21/2017 1131   RBC 4.16 11/21/2008 0700   HGB 13.0 12/21/2017 1131   HCT 38.0 12/21/2017 1131   PLT 229 11/21/2008 0700   MCV 87 12/21/2017 1131   MCH 29.6 12/21/2017 1131   MCHC 34.2 12/21/2017 1131   MCHC 32.0 11/21/2008 0700   RDW 13.3 12/21/2017 1131   LYMPHSABS 1.6 12/21/2017 1131   EOSABS 0.2 12/21/2017 1131   BASOSABS 0.0 12/21/2017 1131   Iron/TIBC/Ferritin/ %Sat    Component Value Date/Time   IRON 50 12/21/2017 1131   TIBC 297 12/21/2017 1131   FERRITIN 194 (H) 12/21/2017 1131   IRONPCTSAT 17 12/21/2017 1131   Lipid Panel     Component Value Date/Time   CHOL 207 (H) 12/21/2017 1131   TRIG 78 12/21/2017 1131   HDL 53 12/21/2017 1131   LDLCALC 138 (H) 12/21/2017 1131   Hepatic Function Panel     Component Value Date/Time   PROT 6.9 12/21/2017 1131   ALBUMIN 4.1 12/21/2017 1131   AST 18 12/21/2017 1131   ALT 16 12/21/2017 1131   ALKPHOS 86 12/21/2017 1131   BILITOT 0.2 12/21/2017 1131      Component Value Date/Time   TSH 0.592 12/21/2017 1131   Results for BRINDY, HIGGINBOTHAM (MRN [id number]) as of 02/10/2018 16:23  Ref. Range 12/21/2017 11:31  Vitamin D, 25-Hydroxy Latest Ref Range: 30.0 - 100.0 ng/mL 39.4   ASSESSMENT AND PLAN: Essential hypertension  Other specified hypothyroidism  Class 2 severe obesity with serious comorbidity and body mass index (BMI) of 35.0 to 35.9 in adult, unspecified obesity type ([place])  PLAN:  Hypertension We discussed sodium restriction, working on healthy weight loss, and a regular exercise program as the means to achieve improved blood pressure control. Rashanna agreed with this plan and agreed to follow up as directed. We will continue to monitor her blood pressure as well as her progress with the above lifestyle modifications. She will continue Hyzaar as prescribed and we will follow up at the next visit. Zyaire will watch for signs  of hypotension as she continues her lifestyle modifications.  Hypothyroid Tayva was informed of the importance of good thyroid control to help with weight loss efforts. She was also informed that supertheraputic thyroid levels are dangerous and will not improve weight loss results. Crysta will continue levothyroxine 112 mcg qAM (no refill needed) and follow up at the agreed upon time.  We spent > than 50% of the 15 minute visit on the counseling as documented in the note.  Obesity Yasmin is currently in the action stage of change. As such, her goal is to continue with weight loss efforts  She has agreed to follow the Category 2 plan Barbarajean has been instructed to work up to a goal of 150 minutes of combined cardio and strengthening exercise per week for weight loss and overall health benefits. We discussed the following Behavioral Modification Strategies today: planning for success, increasing lean protein intake, increasing lower sugar fruits and work on meal planning and easy cooking plans  Essense has agreed to follow up with our clinic in 2 weeks. She was informed of the importance of frequent follow up visits to maximize her success with intensive lifestyle modifications for her multiple health conditions.   OBESITY BEHAVIORAL INTERVENTION VISIT  Today's visit was # 4 out of 22.  Starting weight: 199 lbs Starting date: 12/21/17 Today's weight : 195 lbs Today's date: 02/10/2018 Total lbs lost to date: 4 (Patients must lose 7 lbs in the first 6 months to continue with counseling)   ASK: We discussed the diagnosis of obesity with Ardelle Balls today and Isadore agreed to give [place] permission to discuss obesity behavioral modification therapy today.  ASSESS: Stepfanie has the diagnosis of obesity and her BMI today is 35.66 Valynn is in the action stage of change   ADVISE: Bruchy was educated on the multiple health risks of obesity as well as the benefit of weight loss to improve her  health. She was advised of the need for long term treatment and the importance of lifestyle modifications.  AGREE: Multiple dietary modification options and treatment options were discussed and  Anushree agreed to the above obesity treatment plan.  I, Doreene Nest, am acting as transcriptionist for Eber Jones, MD  I have reviewed the above documentation for accuracy and completeness, and I agree with the above. - Ilene Qua, MD

## 2018-02-11 MED FILL — LOSARTAN-HCTZ 100-12.5 MG T: 100-12.5 | 90 days supply | Qty: 90 | Fill #0

## 2018-02-19 ENCOUNTER — Other Ambulatory Visit: Payer: Self-pay | Admitting: Family Medicine

## 2018-02-19 DIAGNOSIS — Z1231 Encounter for screening mammogram for malignant neoplasm of breast: Secondary | ICD-10-CM

## 2018-02-24 MED FILL — BETAMETHASONE DP 0.05% OINT: 0.05 | 7 days supply | Qty: 15 | Fill #0

## 2018-02-25 ENCOUNTER — Ambulatory Visit (INDEPENDENT_AMBULATORY_CARE_PROVIDER_SITE_OTHER): Payer: 59 | Admitting: Family Medicine

## 2018-02-25 VITALS — BP 128/80 | HR 90 | Temp 97.9°F | Ht 62.0 in | Wt 192.0 lb

## 2018-02-25 DIAGNOSIS — Z6835 Body mass index (BMI) 35.0-35.9, adult: Secondary | ICD-10-CM

## 2018-02-25 DIAGNOSIS — I10 Essential (primary) hypertension: Secondary | ICD-10-CM

## 2018-02-25 DIAGNOSIS — E038 Other specified hypothyroidism: Secondary | ICD-10-CM

## 2018-02-25 NOTE — Progress Notes (Signed)
Office: [phone]  /  Fax: [phone]   HPI:   Chief Complaint: OBESITY [name] is here to discuss her progress with her obesity treatment plan. She is on the Category 2 plan and is following her eating plan approximately 40 % of the time. She states she is doing cardio for 60 minutes 4 times per week. [name] had two birthdays and Father's day, during which she indulged. She tried to make smarter food choices and had controlled her portions. Her weight is 192 lb (87.1 kg) today and has had a weight loss of 3 pounds over a period of 2 weeks since her last visit. She has lost 7 lbs since starting treatment with [place].  Hypertension [name] is a 62 y.o. female with hypertension. [name] denies chest pain, chest pressure or headache. She is working weight loss to help control her blood pressure with the goal of decreasing her risk of heart attack and stroke. [name] blood pressure is controlled today.  Hypothyroidism [name] has a diagnosis of hypothyroidism. She is on levothyroxine. She denies hot or cold intolerance or palpitations  ALLERGIES: Allergies  Allergen Reactions  . Codeine Nausea Only  . Latex Hives and Itching    MEDICATIONS: Current Outpatient Medications on File Prior to Visit  Medication Sig Dispense Refill  . Calcium 600-200 MG-UNIT tablet Take 1 tablet by mouth daily.    . cetirizine (ZYRTEC) 10 MG tablet Take 10 mg by mouth daily.    . diphenhydrAMINE (BENADRYL) 25 mg capsule Take 25 mg by mouth every 6 (six) hours as needed.    . ferrous sulfate 325 (65 FE) MG EC tablet Take 325 mg by mouth 3 (three) times daily with meals.    [name] levothyroxine (SYNTHROID, LEVOTHROID) 112 MCG tablet Take 112 mcg by mouth daily before breakfast.    . losartan-hydrochlorothiazide (HYZAAR) 100-12.5 MG tablet Take 1 tablet by mouth daily.     No current facility-administered medications on file prior to visit.     PAST MEDICAL HISTORY: Past Medical History:    Diagnosis Date  . Allergy   . Graves disease   . High blood pressure   . Hypothyroid   . Lymphedema     PAST SURGICAL HISTORY: Past Surgical History:  Procedure Laterality Date  . CESAREAN SECTION     x3  . UTERINE FIBROID EMBOLIZATION  2010    SOCIAL HISTORY: Social History   Tobacco Use  . Smoking status: Never Smoker  . Smokeless tobacco: Never Used  Substance Use Topics  . Alcohol use: Not Currently  . Drug use: Not Currently    FAMILY HISTORY: Family History  Problem Relation Age of Onset  . Thyroid disease Mother   . Obesity Mother     ROS: Review of Systems  Constitutional: Positive for weight loss.  Cardiovascular: Negative for chest pain and palpitations.       Negative for chest pressure  Neurological: Negative for headaches.  Endo/Heme/Allergies:       Negative for hot or cold intolerance    PHYSICAL EXAM: Blood pressure 128/80, pulse 90, temperature 97.9 F (36.6 C), temperature source Oral, height 5\' 2"  (1.575 m), weight 192 lb (87.1 kg), SpO2 98 %. Body mass index is 35.12 kg/m. Physical Exam  Constitutional: She is oriented to person, place, and time. She appears well-developed and well-nourished.  Cardiovascular: Normal rate.  Pulmonary/Chest: Effort normal.  Musculoskeletal: Normal range of motion.  Neurological: She is oriented to person, place, and time.  Skin: Skin is warm and dry.  Psychiatric: She has a normal mood and affect. Her behavior is normal.  Vitals reviewed.   RECENT LABS AND TESTS: BMET    Component Value Date/Time   NA 141 12/21/2017 1131   K 4.3 12/21/2017 1131   CL 101 12/21/2017 1131   CO2 26 12/21/2017 1131   GLUCOSE 93 12/21/2017 1131   BUN 14 12/21/2017 1131   CREATININE 0.99 12/21/2017 1131   CALCIUM 9.1 12/21/2017 1131   GFRNONAA 63 12/21/2017 1131   GFRAA 72 12/21/2017 1131   Lab Results  Component Value Date   HGBA1C 6.0 (H) 12/21/2017   Lab Results  Component Value Date   INSULIN 10.9  12/21/2017   CBC    Component Value Date/Time   WBC 3.9 12/21/2017 1131   WBC 4.2 11/21/2008 0700   RBC 4.39 12/21/2017 1131   RBC 4.16 11/21/2008 0700   HGB 13.0 12/21/2017 1131   HCT 38.0 12/21/2017 1131   PLT 229 11/21/2008 0700   MCV 87 12/21/2017 1131   MCH 29.6 12/21/2017 1131   MCHC 34.2 12/21/2017 1131   MCHC 32.0 11/21/2008 0700   RDW 13.3 12/21/2017 1131   LYMPHSABS 1.6 12/21/2017 1131   EOSABS 0.2 12/21/2017 1131   BASOSABS 0.0 12/21/2017 1131   Iron/TIBC/Ferritin/ %Sat    Component Value Date/Time   IRON 50 12/21/2017 1131   TIBC 297 12/21/2017 1131   FERRITIN 194 (H) 12/21/2017 1131   IRONPCTSAT 17 12/21/2017 1131   Lipid Panel     Component Value Date/Time   CHOL 207 (H) 12/21/2017 1131   TRIG 78 12/21/2017 1131   HDL 53 12/21/2017 1131   LDLCALC 138 (H) 12/21/2017 1131   Hepatic Function Panel     Component Value Date/Time   PROT 6.9 12/21/2017 1131   ALBUMIN 4.1 12/21/2017 1131   AST 18 12/21/2017 1131   ALT 16 12/21/2017 1131   ALKPHOS 86 12/21/2017 1131   BILITOT 0.2 12/21/2017 1131      Component Value Date/Time   TSH 0.592 12/21/2017 1131   Results for Alexis, Navarro (MRN [id number]) as of 02/25/2018 16:52  Ref. Range 12/21/2017 11:31  Vitamin D, 25-Hydroxy Latest Ref Range: 30.0 - 100.0 ng/mL 39.4   ASSESSMENT AND PLAN: Essential hypertension  Other specified hypothyroidism  Class 2 severe obesity with serious comorbidity and body mass index (BMI) of 35.0 to 35.9 in adult, unspecified obesity type ([place])  PLAN:  Hypertension We discussed sodium restriction, working on healthy weight loss, and a regular exercise program as the means to achieve improved blood pressure control. [name] agreed with this plan and agreed to follow up as directed. We will continue to monitor her blood pressure as well as her progress with the above lifestyle modifications. She will continue Hyzaar as prescribed and will watch for signs of hypotension as she  continues her lifestyle modifications.  Hypothyroidism [name] was informed of the importance of good thyroid control to help with weight loss efforts. She was also informed that supertheraputic thyroid levels are dangerous and will not improve weight loss results. Andjela will continue levothyroxine 112 mcg every morning (no refill needed) and follow up as directed.  We spent > than 50% of the 15 minute visit on the counseling as documented in the note.  Obesity [name] is currently in the action stage of change. As such, her goal is to continue with weight loss efforts She has agreed to follow the Category 2 plan [name] has  been instructed to work up to a goal of 150 minutes of combined cardio and strengthening exercise per week for weight loss and overall health benefits. We discussed the following Behavioral Modification Strategies today: planning for success, increasing lean protein intake, increasing vegetables and work on meal planning and easy cooking plans  [name] has agreed to follow up with our clinic in 2 weeks. She was informed of the importance of frequent follow up visits to maximize her success with intensive lifestyle modifications for her multiple health conditions.   OBESITY BEHAVIORAL INTERVENTION VISIT  Today's visit was # 5 out of 22.  Starting weight: 199 lbs Starting date: 12/21/17 Today's weight : 192 lbs Today's date: 02/25/2018 Total lbs lost to date: 7 (Patients must lose 7 lbs in the first 6 months to continue with counseling)   ASK: We discussed the diagnosis of obesity with [name] today and [name] agreed to give [place] permission to discuss obesity behavioral modification therapy today.  ASSESS: [name] has the diagnosis of obesity and her BMI today is 35.11 [name] is in the action stage of change   ADVISE: [name] was educated on the multiple health risks of obesity as well as the benefit of weight loss to improve her health. She was advised of the  need for long term treatment and the importance of lifestyle modifications.  AGREE: Multiple dietary modification options and treatment options were discussed and  [name] agreed to the above obesity treatment plan.  I, Doreene Nest, am acting as transcriptionist for Eber Jones, MD  I have reviewed the above documentation for accuracy and completeness, and I agree with the above. - Ilene Qua, MD

## 2018-03-05 MED FILL — SYNTHROID 112 MCG TABLET: 112 | 30 days supply | Qty: 30 | Fill #1

## 2018-03-09 ENCOUNTER — Ambulatory Visit (INDEPENDENT_AMBULATORY_CARE_PROVIDER_SITE_OTHER): Payer: 59 | Admitting: Family Medicine

## 2018-03-09 ENCOUNTER — Ambulatory Visit
Admission: RE | Admit: 2018-03-09 | Discharge: 2018-03-09 | Disposition: A | Discharge status: Home / Self Care | Payer: 59 | Source: Ambulatory Visit | Attending: Family Medicine | Admitting: Family Medicine

## 2018-03-09 VITALS — BP 129/74 | HR 69 | Temp 97.7°F | Ht 62.0 in | Wt 194.0 lb

## 2018-03-09 DIAGNOSIS — E039 Hypothyroidism, unspecified: Secondary | ICD-10-CM | POA: Diagnosis not present

## 2018-03-09 DIAGNOSIS — Z1231 Encounter for screening mammogram for malignant neoplasm of breast: Secondary | ICD-10-CM

## 2018-03-09 DIAGNOSIS — Z6835 Body mass index (BMI) 35.0-35.9, adult: Secondary | ICD-10-CM | POA: Diagnosis not present

## 2018-03-09 DIAGNOSIS — I10 Essential (primary) hypertension: Secondary | ICD-10-CM

## 2018-03-09 NOTE — Progress Notes (Signed)
Office: [phone]  /  Fax: [phone]   HPI:   Chief Complaint: [name] Navarro is here to discuss her progress with her [name] treatment plan. She is on the Category 2 plan and is following her eating plan approximately 40 % of the time. She states she is walking, biking and exercising on the elliptical for 90 minutes 3 to 5 times per week. [name] Navarro had quite a few celebrations with a few indulgences. She plans to stay home for the 4th of July. Her weight is 194 lb (88 kg) today and has had a weight gain of 2 pounds over a period of 2 weeks since her last Navarro. She has lost 5 lbs since starting treatment with [name] Navarro.  Hypertension [name] Navarro is a 62 y.o. female with hypertension. [name] Navarro denies chest pain, chest Navarro or headache.  She is working weight loss to help control her blood Navarro with the goal of decreasing her risk of heart attack and stroke. [name] Navarro is controlled today.  Hypothyroidism [name] Navarro has a diagnosis of hypothyroidism. She is on levothyroxine. She denies hot or cold intolerance or palpitations, but does admit to ongoing fatigue.  ALLERGIES: Allergies  Allergen Reactions  . Codeine Nausea Only  . Latex Hives and Itching    MEDICATIONS: Current Outpatient Medications on File Prior to Navarro  Medication Sig Dispense Refill  . Calcium 600-200 MG-UNIT tablet Take 1 tablet by mouth daily.    . cetirizine (ZYRTEC) 10 MG tablet Take 10 mg by mouth daily.    . diphenhydrAMINE (BENADRYL) 25 mg capsule Take 25 mg by mouth every 6 (six) hours as needed.    . ferrous sulfate 325 (65 FE) MG EC tablet Take 325 mg by mouth 3 (three) times daily with meals.    [name] Navarro Kitchen levothyroxine (SYNTHROID, LEVOTHROID) 112 MCG tablet Take 112 mcg by mouth daily before breakfast.    . losartan-hydrochlorothiazide (HYZAAR) 100-12.5 MG tablet Take 1 tablet by mouth daily.     No current facility-administered medications on file prior to Navarro.     PAST MEDICAL  HISTORY: Past Medical History:  Diagnosis Date  . Allergy   . Graves disease   . High blood Navarro   . Hypothyroid   . Lymphedema     PAST SURGICAL HISTORY: Past Surgical History:  Procedure Laterality Date  . CESAREAN SECTION     x3  . UTERINE FIBROID EMBOLIZATION  2010    SOCIAL HISTORY: Social History   Tobacco Use  . Smoking status: Never Smoker  . Smokeless tobacco: Never Used  Substance Use Topics  . Alcohol use: Not Currently  . Drug use: Not Currently    FAMILY HISTORY: Family History  Problem Relation Age of Onset  . Thyroid disease [name]   . [name] Navarro   . Breast cancer Sister     ROS: Review of Systems  Constitutional: Positive for malaise/fatigue. Negative for weight loss.  Cardiovascular: Negative for chest pain and palpitations.       Negative for chest Navarro  Neurological: Negative for headaches.  Endo/Heme/Allergies:       Negative for heat or cold intolerance    PHYSICAL EXAM: Blood Navarro 129/74, pulse 69, temperature 97.7 F (36.5 C), temperature source Oral, height 5\' 2"  (1.575 m), weight 194 lb (88 kg), last menstrual period 12/29/2011, SpO2 100 %. Body mass index is 35.48 kg/m. Physical Exam  Constitutional: She is oriented to person, place, and time. She appears well-developed and well-nourished.  Cardiovascular:  Normal rate.  Pulmonary/Chest: Effort normal.  Musculoskeletal: Normal range of motion.  Neurological: She is oriented to person, place, and time.  Skin: Skin is warm and dry.  Psychiatric: She has a normal mood and affect. Her behavior is normal.  Vitals reviewed.   RECENT LABS AND TESTS: BMET    Component Value Date/Time   NA 141 12/21/2017 1131   K 4.3 12/21/2017 1131   CL 101 12/21/2017 1131   CO2 26 12/21/2017 1131   GLUCOSE 93 12/21/2017 1131   BUN 14 12/21/2017 1131   CREATININE 0.99 12/21/2017 1131   CALCIUM 9.1 12/21/2017 1131   GFRNONAA 63 12/21/2017 1131   GFRAA 72 12/21/2017 1131    Lab Results  Component Value Date   HGBA1C 6.0 (H) 12/21/2017   Lab Results  Component Value Date   INSULIN 10.9 12/21/2017   CBC    Component Value Date/Time   WBC 3.9 12/21/2017 1131   WBC 4.2 11/21/2008 0700   RBC 4.39 12/21/2017 1131   RBC 4.16 11/21/2008 0700   HGB 13.0 12/21/2017 1131   HCT 38.0 12/21/2017 1131   PLT 229 11/21/2008 0700   MCV 87 12/21/2017 1131   MCH 29.6 12/21/2017 1131   MCHC 34.2 12/21/2017 1131   MCHC 32.0 11/21/2008 0700   RDW 13.3 12/21/2017 1131   LYMPHSABS 1.6 12/21/2017 1131   EOSABS 0.2 12/21/2017 1131   BASOSABS 0.0 12/21/2017 1131   Iron/TIBC/Ferritin/ %Sat    Component Value Date/Time   IRON 50 12/21/2017 1131   TIBC 297 12/21/2017 1131   FERRITIN 194 (H) 12/21/2017 1131   IRONPCTSAT 17 12/21/2017 1131   Lipid Panel     Component Value Date/Time   CHOL 207 (H) 12/21/2017 1131   TRIG 78 12/21/2017 1131   HDL 53 12/21/2017 1131   LDLCALC 138 (H) 12/21/2017 1131   Hepatic Function Panel     Component Value Date/Time   PROT 6.9 12/21/2017 1131   ALBUMIN 4.1 12/21/2017 1131   AST 18 12/21/2017 1131   ALT 16 12/21/2017 1131   ALKPHOS 86 12/21/2017 1131   BILITOT 0.2 12/21/2017 1131      Component Value Date/Time   TSH 0.592 12/21/2017 1131   Results for Alexis, Navarro (MRN [id number]) as of 03/09/2018 15:38  Ref. Range 12/21/2017 11:31  Vitamin D, 25-Hydroxy Latest Ref Range: 30.0 - 100.0 ng/mL 39.4   ASSESSMENT AND PLAN: Essential hypertension  Hypothyroidism, unspecified type  Class 2 severe [name] with serious comorbidity and body mass index (BMI) of 35.0 to 35.9 in adult, unspecified [name] type ([place])  PLAN:  Hypertension [name] discussed sodium restriction, working on healthy weight loss, and a regular exercise program as the means to achieve improved blood Navarro control. [name] agreed with this plan and agreed to follow up as directed. [name] will continue to monitor her blood Navarro as well as her progress  with the above lifestyle modifications. She will continue Hyzaar as prescribed and will watch for signs of hypotension as she continues her lifestyle modifications.  Hypothyroidism [name] Navarro was informed of the importance of good thyroid control to help with weight loss efforts. She was also informed that supertheraputic thyroid levels are dangerous and will not improve weight loss results. [name] Navarro will continue levothyroxine as prescribed and [name] will retest TSH in 1 month.  [name] Navarro on the counseling as documented in the note.  [name] Navarro is currently in the action stage of change. As  such, her goal is to continue with weight loss efforts She has agreed to follow the [place] eating plan [name] Navarro has been instructed to work up to a goal of 150 minutes of combined cardio and strengthening exercise per week for weight loss and overall health benefits. [name] discussed the following Behavioral Modification Strategies today: planning for success, increasing lean protein intake, increasing vegetables and work on meal planning and easy cooking plans  [name] Navarro has agreed to follow up with our clinic in 2 weeks. She was informed of the importance of frequent follow up visits to maximize her success with intensive lifestyle modifications for her multiple health conditions.   [name] BEHAVIORAL INTERVENTION Navarro  Today's Navarro was # 6 out of 22.  Starting weight: 199 lbs Starting date: 12/21/17 Today's weight : 194 lbs Today's date: 03/09/2018 Total lbs lost to date: 5 (Patients must lose 7 lbs in the first 6 months to continue with counseling)   ASK: [name] discussed the diagnosis of [name] with [name] Navarro today and Aiva agreed to give [name] Navarro permission to discuss [name] behavioral modification therapy today.  ASSESS: Alany has the diagnosis of [name] and her BMI today is 35.47 Tiffanny is in the action stage of change   ADVISE: Jakyah was educated on the  multiple health risks of [name] as well as the benefit of weight loss to improve her health. She was advised of the need for long term treatment and the importance of lifestyle modifications.  AGREE: Multiple dietary modification options and treatment options were discussed and  Jenicka agreed to the above [name] treatment plan.  I, Doreene Nest, am acting as transcriptionist for Eber Jones, MD  I have reviewed the above documentation for accuracy and completeness, and I agree with the above. - Ilene Qua, MD

## 2018-03-30 ENCOUNTER — Encounter (INDEPENDENT_AMBULATORY_CARE_PROVIDER_SITE_OTHER): Payer: Self-pay

## 2018-03-30 ENCOUNTER — Ambulatory Visit (INDEPENDENT_AMBULATORY_CARE_PROVIDER_SITE_OTHER): Payer: 59 | Admitting: Family Medicine

## 2018-04-05 MED FILL — SYNTHROID 112 MCG TABLET: 112 | 30 days supply | Qty: 30 | Fill #2

## 2018-04-05 MED FILL — FLUTICASONE PROP 0.005% OIN: 0.005 | 7 days supply | Qty: 30 | Fill #2

## 2018-04-13 ENCOUNTER — Ambulatory Visit (INDEPENDENT_AMBULATORY_CARE_PROVIDER_SITE_OTHER): Payer: 59 | Admitting: Family Medicine

## 2018-04-13 VITALS — BP 119/71 | HR 60 | Temp 97.6°F | Ht 62.0 in | Wt 194.0 lb

## 2018-04-13 DIAGNOSIS — E038 Other specified hypothyroidism: Secondary | ICD-10-CM | POA: Diagnosis not present

## 2018-04-13 DIAGNOSIS — I10 Essential (primary) hypertension: Secondary | ICD-10-CM | POA: Diagnosis not present

## 2018-04-13 DIAGNOSIS — Z6835 Body mass index (BMI) 35.0-35.9, adult: Secondary | ICD-10-CM

## 2018-04-13 NOTE — Progress Notes (Signed)
Office: [phone]  /  Fax: [phone]   HPI:   Chief Complaint: OBESITY [name] is here to discuss her progress with her obesity treatment plan. She is on the Pescatarian eating plan and is following her eating plan approximately 20 % of the time. She states she is exercising 90 minutes 4-5 times per week. [name] had lots of activities at [employer]. [name]. Alexis plans for the rest of the summer.  Her weight is 194 lb (88 kg) today and has not lost weight since her last visit. She has lost 5 lbs since starting treatment with [place].  Hypertension [name] is a 62 y.o. female with hypertension. [name]'s blood pressure is controlled today. She denies chest pain, chest pressure, or headache. She is working weight loss to help control her blood pressure with the goal of decreasing her risk of heart attack and stroke.   Hypothyroidism [name] has a diagnosis of hypothyroidism. She is on levothyroxine. She denies hot or cold intolerance or palpitations.  ALLERGIES: Allergies  Allergen Reactions  . Codeine Nausea Only  . Latex Hives and Itching    MEDICATIONS: Current Outpatient Medications on File Prior to Visit  Medication Sig Dispense Refill  . Calcium 600-200 MG-UNIT tablet Take 2 tablets by mouth daily.     . cetirizine (ZYRTEC) 10 MG tablet Take 10 mg by mouth daily.    . diphenhydrAMINE (BENADRYL) 25 mg capsule Take 25 mg by mouth every 6 (six) hours as needed.    . ferrous sulfate 325 (65 FE) MG EC tablet Take 325 mg by mouth 3 (three) times daily with meals.    [name] levothyroxine (SYNTHROID, LEVOTHROID) 112 MCG tablet Take 112 mcg by mouth daily before breakfast.    . losartan-hydrochlorothiazide (HYZAAR) 100-12.5 MG tablet Take 1 tablet by mouth daily.     Alexis current facility-administered medications on file prior to visit.     PAST MEDICAL HISTORY: Past Medical History:  Diagnosis Date  . Allergy   . Graves disease   .  High blood pressure   . Hypothyroid   . Lymphedema     PAST SURGICAL HISTORY: Past Surgical History:  Procedure Laterality Date  . CESAREAN SECTION     x3  . UTERINE FIBROID EMBOLIZATION  2010    SOCIAL HISTORY: Social History   Tobacco Use  . Smoking status: Never Smoker  . Smokeless tobacco: Never Used  Substance Use Topics  . Alcohol use: Not Currently  . Drug use: Not Currently    FAMILY HISTORY: Family History  Problem Relation Age of Onset  . Thyroid disease Mother   . Obesity Mother   . Breast cancer Sister     ROS: Review of Systems  Constitutional: Negative for weight loss.  Cardiovascular: Negative for chest pain and palpitations.       Negative chest pressure  Neurological: Negative for headaches.  Endo/Heme/Allergies:       Negative hot/cold intolerance    PHYSICAL EXAM: Blood pressure 119/71, pulse 60, temperature 97.6 F (36.4 C), temperature source Oral, height 5\' 2"  (1.575 m), weight 194 lb (88 kg), last menstrual period 12/29/2011, SpO2 100 %. Body mass index is 35.48 kg/m. Physical Exam  Constitutional: She is oriented to person, place, and time. She appears well-developed and well-nourished.  Cardiovascular: Normal rate.  Pulmonary/Chest: Effort normal.  Musculoskeletal: Normal range of motion.  Neurological: She is oriented to person, place, and time.  Skin: Skin is warm  and dry.  Psychiatric: She has a normal mood and affect. Her behavior is normal.  Vitals reviewed.   RECENT LABS AND TESTS: BMET    Component Value Date/Time   NA 141 12/21/2017 1131   K 4.3 12/21/2017 1131   CL 101 12/21/2017 1131   CO2 26 12/21/2017 1131   GLUCOSE 93 12/21/2017 1131   BUN 14 12/21/2017 1131   CREATININE 0.99 12/21/2017 1131   CALCIUM 9.1 12/21/2017 1131   GFRNONAA 63 12/21/2017 1131   GFRAA 72 12/21/2017 1131   Lab Results  Component Value Date   HGBA1C 6.0 (H) 12/21/2017   Lab Results  Component Value Date   INSULIN 10.9 12/21/2017     CBC    Component Value Date/Time   WBC 3.9 12/21/2017 1131   WBC 4.2 11/21/2008 0700   RBC 4.39 12/21/2017 1131   RBC 4.16 11/21/2008 0700   HGB 13.0 12/21/2017 1131   HCT 38.0 12/21/2017 1131   PLT 229 11/21/2008 0700   MCV 87 12/21/2017 1131   MCH 29.6 12/21/2017 1131   MCHC 34.2 12/21/2017 1131   MCHC 32.0 11/21/2008 0700   RDW 13.3 12/21/2017 1131   LYMPHSABS 1.6 12/21/2017 1131   EOSABS 0.2 12/21/2017 1131   BASOSABS 0.0 12/21/2017 1131   Iron/TIBC/Ferritin/ %Sat    Component Value Date/Time   IRON 50 12/21/2017 1131   TIBC 297 12/21/2017 1131   FERRITIN 194 (H) 12/21/2017 1131   IRONPCTSAT 17 12/21/2017 1131   Lipid Panel     Component Value Date/Time   CHOL 207 (H) 12/21/2017 1131   TRIG 78 12/21/2017 1131   HDL 53 12/21/2017 1131   LDLCALC 138 (H) 12/21/2017 1131   Hepatic Function Panel     Component Value Date/Time   PROT 6.9 12/21/2017 1131   ALBUMIN 4.1 12/21/2017 1131   AST 18 12/21/2017 1131   ALT 16 12/21/2017 1131   ALKPHOS 86 12/21/2017 1131   BILITOT 0.2 12/21/2017 1131      Component Value Date/Time   TSH 0.592 12/21/2017 1131    ASSESSMENT AND PLAN: Essential hypertension  Other specified hypothyroidism  Class 2 severe obesity with serious comorbidity and body mass index (BMI) of 35.0 to 35.9 in adult, unspecified obesity type (HCC)  PLAN:  Hypertension We discussed sodium restriction, working on healthy weight loss, and a regular exercise program as the means to achieve improved blood pressure control. [name] with this plan and Navarro to follow up as directed. We will continue to monitor her blood pressure as well as her progress with the above lifestyle modifications. She will continue her current medications and will watch for signs of hypotension as she continues her lifestyle modifications. [name] agrees to follow up with our clinic in 2 Navarro.  Hypothyroidism [name] was informed of the importance of good thyroid  control to help with weight loss efforts. She was also informed that supertheraputic thyroid levels are dangerous and will not improve weight loss results. [name] agrees to continue taking levothyroxine and we will check labs at next visit. [name] agrees to follow up with our clinic in 2 Navarro.  We spent > than 50% of the 15 minute visit on the counseling as documented in the note.  Obesity [name] is currently in the action stage of change. As such, her goal is to continue with weight loss efforts She has Navarro to follow the [name] eating plan [name] has been instructed to work up to a goal of 150 minutes of  combined cardio and strengthening exercise per week for weight loss and overall health benefits. We discussed the following Behavioral Modification Strategies today: increasing lean protein intake, increasing vegetables, work on meal planning and easy cooking plans, better snacking choices, and planning for success   [name] has Navarro to follow up with our clinic in 2 Navarro. She was informed of the importance of frequent follow up visits to maximize her success with intensive lifestyle modifications for her multiple health conditions.   OBESITY BEHAVIORAL INTERVENTION VISIT  Today's visit was # 7 out of 22.  Starting weight: 199 lbs Starting date: 12/21/17 Today's weight : 194 lbs  Today's date: 04/13/2018 Total lbs lost to date: 5    ASK: We discussed the diagnosis of obesity with Ardelle Balls today and Stevey Navarro to give [place] permission to discuss obesity behavioral modification therapy today.  ASSESS: Estephany has the diagnosis of obesity and her BMI today is 35.47 Charlen is in the action stage of change   ADVISE: Marialuiza was educated on the multiple health risks of obesity as well as the benefit of weight loss to improve her health. She was advised of the need for long term treatment and the importance of lifestyle modifications.  AGREE: Multiple dietary modification  options and treatment options were discussed and  Michaelle Navarro to the above obesity treatment plan.  I, Trixie Dredge, am acting as transcriptionist for Ilene Qua, MD  I have reviewed the above documentation for accuracy and completeness, and I agree with the above. - Ilene Qua, MD

## 2018-04-29 ENCOUNTER — Ambulatory Visit (INDEPENDENT_AMBULATORY_CARE_PROVIDER_SITE_OTHER): Payer: Self-pay | Admitting: Family Medicine

## 2018-04-29 ENCOUNTER — Encounter (INDEPENDENT_AMBULATORY_CARE_PROVIDER_SITE_OTHER): Payer: Self-pay

## 2018-05-03 MED FILL — BETAMETHASONE DP 0.05% OINT: 0.05 | 7 days supply | Qty: 15 | Fill #0

## 2018-05-04 MED FILL — SYNTHROID 112 MCG TABLET: 112 | 30 days supply | Qty: 30 | Fill #3

## 2018-05-25 MED FILL — LOSARTAN-HCTZ 100-12.5 MG T: 100-12.5 | 90 days supply | Qty: 90 | Fill #1

## 2018-06-02 MED FILL — SYNTHROID 112 MCG TABLET: 112 | 30 days supply | Qty: 30 | Fill #4

## 2018-06-09 MED FILL — FLUTICASONE PROP 0.005% OIN: 0.005 | 7 days supply | Qty: 30 | Fill #0

## 2018-06-14 MED FILL — BETAMETHASONE DP 0.05% OINT: 0.05 | 7 days supply | Qty: 15 | Fill #0

## 2018-07-07 MED FILL — SYNTHROID 112 MCG TABLET: 112 | 30 days supply | Qty: 30 | Fill #5

## 2018-07-27 DIAGNOSIS — L659 Nonscarring hair loss, unspecified: Secondary | ICD-10-CM | POA: Diagnosis not present

## 2018-07-27 DIAGNOSIS — D229 Melanocytic nevi, unspecified: Secondary | ICD-10-CM | POA: Diagnosis not present

## 2018-07-27 DIAGNOSIS — Z01419 Encounter for gynecological examination (general) (routine) without abnormal findings: Secondary | ICD-10-CM | POA: Diagnosis not present

## 2018-07-27 DIAGNOSIS — D219 Benign neoplasm of connective and other soft tissue, unspecified: Secondary | ICD-10-CM | POA: Diagnosis not present

## 2018-08-09 MED FILL — SYNTHROID 112 MCG TABLET: 112 | 30 days supply | Qty: 30 | Fill #0

## 2018-08-09 MED FILL — BETAMETHASONE DP 0.05% OINT: 0.05 | 7 days supply | Qty: 15 | Fill #0

## 2018-08-18 ENCOUNTER — Ambulatory Visit
Admission: RE | Admit: 2018-08-18 | Discharge: 2018-08-18 | Disposition: A | Discharge status: Home / Self Care | Payer: 59 | Source: Ambulatory Visit | Attending: Physician Assistant | Admitting: Physician Assistant

## 2018-08-18 ENCOUNTER — Other Ambulatory Visit: Payer: Self-pay | Admitting: Physician Assistant

## 2018-08-18 DIAGNOSIS — M7989 Other specified soft tissue disorders: Principal | ICD-10-CM

## 2018-08-18 DIAGNOSIS — M25572 Pain in left ankle and joints of left foot: Secondary | ICD-10-CM | POA: Diagnosis not present

## 2018-08-18 DIAGNOSIS — M79605 Pain in left leg: Secondary | ICD-10-CM

## 2018-08-18 DIAGNOSIS — M79672 Pain in left foot: Secondary | ICD-10-CM | POA: Diagnosis not present

## 2018-08-18 MED FILL — DICLOFENAC SOD EC 50 MG TAB: 50 | 20 days supply | Qty: 40 | Fill #0

## 2018-08-30 MED FILL — HYDROCHLOROTHIAZIDE 12.5 MG: 12.5 | 90 days supply | Qty: 90 | Fill #0

## 2018-08-30 MED FILL — LOSARTAN POTASSIUM 100 MG T: 100 | 90 days supply | Qty: 90 | Fill #0

## 2018-09-03 DIAGNOSIS — Z0000 Encounter for general adult medical examination without abnormal findings: Secondary | ICD-10-CM | POA: Diagnosis not present

## 2018-09-03 DIAGNOSIS — E669 Obesity, unspecified: Secondary | ICD-10-CM | POA: Diagnosis not present

## 2018-09-03 DIAGNOSIS — D649 Anemia, unspecified: Secondary | ICD-10-CM | POA: Diagnosis not present

## 2018-09-03 DIAGNOSIS — I10 Essential (primary) hypertension: Secondary | ICD-10-CM | POA: Diagnosis not present

## 2018-09-03 DIAGNOSIS — E7800 Pure hypercholesterolemia, unspecified: Secondary | ICD-10-CM | POA: Diagnosis not present

## 2018-09-03 DIAGNOSIS — I890 Lymphedema, not elsewhere classified: Secondary | ICD-10-CM | POA: Diagnosis not present

## 2018-09-03 DIAGNOSIS — E039 Hypothyroidism, unspecified: Secondary | ICD-10-CM | POA: Diagnosis not present

## 2018-09-03 DIAGNOSIS — Z23 Encounter for immunization: Secondary | ICD-10-CM | POA: Diagnosis not present

## 2018-09-06 MED FILL — SYNTHROID 112 MCG TABLET: 112 | 30 days supply | Qty: 30 | Fill #1

## 2018-09-17 MED FILL — FLUTICASONE PROP 0.005% OIN: 0.005 | 7 days supply | Qty: 30 | Fill #0

## 2018-09-22 DIAGNOSIS — I890 Lymphedema, not elsewhere classified: Secondary | ICD-10-CM | POA: Diagnosis not present

## 2018-09-28 DIAGNOSIS — I890 Lymphedema, not elsewhere classified: Secondary | ICD-10-CM | POA: Diagnosis not present

## 2018-10-05 DIAGNOSIS — I890 Lymphedema, not elsewhere classified: Secondary | ICD-10-CM | POA: Diagnosis not present

## 2018-10-07 DIAGNOSIS — I890 Lymphedema, not elsewhere classified: Secondary | ICD-10-CM | POA: Diagnosis not present

## 2018-10-07 MED FILL — SYNTHROID 112 MCG TABLET: 112 | 30 days supply | Qty: 30 | Fill #2

## 2018-10-12 DIAGNOSIS — I890 Lymphedema, not elsewhere classified: Secondary | ICD-10-CM | POA: Diagnosis not present

## 2018-10-14 DIAGNOSIS — I890 Lymphedema, not elsewhere classified: Secondary | ICD-10-CM | POA: Diagnosis not present

## 2018-10-30 MED FILL — FLUTICASONE PROP 0.005% OIN: 0.005 | 7 days supply | Qty: 30 | Fill #0

## 2018-11-01 DIAGNOSIS — L2089 Other atopic dermatitis: Secondary | ICD-10-CM | POA: Diagnosis not present

## 2018-11-01 MED FILL — BETAMETHASONE DP 0.05% OINT: 0.05 | 10 days supply | Qty: 45 | Fill #0

## 2018-11-08 MED FILL — SYNTHROID 112 MCG TABLET: 112 | 30 days supply | Qty: 30 | Fill #0 | Status: TO

## 2018-11-24 MED FILL — BETAMETHASONE DP 0.05% OINT: 0.05 | 10 days supply | Qty: 45 | Fill #1

## 2018-11-25 DIAGNOSIS — H35412 Lattice degeneration of retina, left eye: Secondary | ICD-10-CM | POA: Diagnosis not present

## 2018-11-25 DIAGNOSIS — E0500 Thyrotoxicosis with diffuse goiter without thyrotoxic crisis or storm: Secondary | ICD-10-CM | POA: Diagnosis not present

## 2018-11-25 DIAGNOSIS — H43392 Other vitreous opacities, left eye: Secondary | ICD-10-CM | POA: Diagnosis not present

## 2018-11-30 MED FILL — HYDROCHLOROTHIAZIDE 12.5 MG: 12.5 | 90 days supply | Qty: 90 | Fill #0

## 2018-11-30 MED FILL — LOSARTAN POTASSIUM 100 MG T: 100 | 90 days supply | Qty: 90 | Fill #0

## 2018-12-06 MED FILL — SYNTHROID 112 MCG TABLET: 112 | 60 days supply | Qty: 60 | Fill #0 | Status: TO

## 2019-01-06 DIAGNOSIS — N3090 Cystitis, unspecified without hematuria: Secondary | ICD-10-CM | POA: Diagnosis not present

## 2019-01-06 MED FILL — CIPROFLOXACIN HCL 250 MG TA: 250 | 7 days supply | Qty: 14 | Fill #0

## 2019-02-04 MED FILL — SYNTHROID 112 MCG TABLET: 112 | 60 days supply | Qty: 60 | Fill #0

## 2019-02-07 MED FILL — BETAMETHASONE DP 0.05% OINT: 0.05 | 10 days supply | Qty: 45 | Fill #0

## 2019-02-22 ENCOUNTER — Other Ambulatory Visit: Payer: Self-pay | Admitting: Family Medicine

## 2019-02-22 DIAGNOSIS — Z1231 Encounter for screening mammogram for malignant neoplasm of breast: Secondary | ICD-10-CM

## 2019-03-04 MED FILL — HYDROCHLOROTHIAZIDE 12.5 MG: 12.5 | 90 days supply | Qty: 90 | Fill #0

## 2019-03-04 MED FILL — LOSARTAN POTASSIUM 100 MG T: 100 | 90 days supply | Qty: 90 | Fill #0

## 2019-04-05 MED FILL — SYNTHROID 112 MCG TABLET: 112 | 30 days supply | Qty: 30 | Fill #1

## 2019-04-08 ENCOUNTER — Ambulatory Visit
Admission: RE | Admit: 2019-04-08 | Discharge: 2019-04-08 | Disposition: A | Discharge status: Home / Self Care | Payer: 59 | Source: Ambulatory Visit | Attending: Family Medicine | Admitting: Family Medicine

## 2019-04-08 ENCOUNTER — Other Ambulatory Visit: Payer: Self-pay

## 2019-04-08 DIAGNOSIS — Z1231 Encounter for screening mammogram for malignant neoplasm of breast: Secondary | ICD-10-CM | POA: Diagnosis not present

## 2019-04-15 MED FILL — BETAMETHASONE DP 0.05% OINT: 0.05 | 10 days supply | Qty: 45 | Fill #0

## 2019-05-06 MED FILL — SYNTHROID 112 MCG TABLET: 112 | 30 days supply | Qty: 30 | Fill #0

## 2019-05-25 DIAGNOSIS — I10 Essential (primary) hypertension: Secondary | ICD-10-CM | POA: Diagnosis not present

## 2019-05-25 DIAGNOSIS — Z23 Encounter for immunization: Secondary | ICD-10-CM | POA: Diagnosis not present

## 2019-05-25 DIAGNOSIS — E7800 Pure hypercholesterolemia, unspecified: Secondary | ICD-10-CM | POA: Diagnosis not present

## 2019-05-25 DIAGNOSIS — E039 Hypothyroidism, unspecified: Secondary | ICD-10-CM | POA: Diagnosis not present

## 2019-06-02 MED FILL — SYNTHROID 112 MCG TABLET: 112 | 30 days supply | Qty: 30 | Fill #1

## 2019-06-02 MED FILL — HYDROCHLOROTHIAZIDE 12.5 MG: 12.5 | 90 days supply | Qty: 90 | Fill #1

## 2019-06-02 MED FILL — LOSARTAN POTASSIUM 100 MG T: 100 | 90 days supply | Qty: 90 | Fill #1

## 2019-06-29 MED FILL — BETAMETHASONE DP 0.05% OINT: 0.05 | 7 days supply | Qty: 45 | Fill #0

## 2019-07-06 ENCOUNTER — Other Ambulatory Visit: Payer: Self-pay

## 2019-07-06 DIAGNOSIS — Z20822 Contact with and (suspected) exposure to covid-19: Secondary | ICD-10-CM

## 2019-07-06 DIAGNOSIS — Z20828 Contact with and (suspected) exposure to other viral communicable diseases: Secondary | ICD-10-CM | POA: Diagnosis not present

## 2019-07-07 LAB — NOVEL CORONAVIRUS, NAA: SARS-CoV-2, NAA: NOT DETECTED

## 2019-07-08 MED FILL — SYNTHROID 112 MCG TABLET: 112 | 30 days supply | Qty: 30 | Fill #2

## 2019-08-02 ENCOUNTER — Other Ambulatory Visit: Payer: Self-pay | Admitting: Obstetrics and Gynecology

## 2019-08-02 ENCOUNTER — Other Ambulatory Visit (HOSPITAL_COMMUNITY)
Admission: RE | Admit: 2019-08-02 | Discharge: 2019-08-02 | Disposition: A | Discharge status: Home / Self Care | Payer: 59 | Source: Ambulatory Visit | Attending: Obstetrics and Gynecology | Admitting: Obstetrics and Gynecology

## 2019-08-02 DIAGNOSIS — Z01419 Encounter for gynecological examination (general) (routine) without abnormal findings: Secondary | ICD-10-CM | POA: Diagnosis not present

## 2019-08-02 DIAGNOSIS — N949 Unspecified condition associated with female genital organs and menstrual cycle: Secondary | ICD-10-CM | POA: Diagnosis not present

## 2019-08-03 MED FILL — SULFAMETHOXAZOLE-TMP DS TAB: 800-160 | 3 days supply | Qty: 6 | Fill #0

## 2019-08-05 LAB — CYTOLOGY - PAP
Comment: NEGATIVE
Diagnosis: UNDETERMINED — AB
High risk HPV: NEGATIVE

## 2019-08-09 MED FILL — SYNTHROID 112 MCG TABLET: 112 | 30 days supply | Qty: 30 | Fill #0

## 2019-08-10 DIAGNOSIS — N949 Unspecified condition associated with female genital organs and menstrual cycle: Secondary | ICD-10-CM | POA: Diagnosis not present

## 2019-08-22 MED FILL — BETAMETHASONE DP 0.05% OINT: 0.05 | 10 days supply | Qty: 45 | Fill #0

## 2019-09-05 MED FILL — HYDROCHLOROTHIAZIDE 12.5 MG: 12.5 | 90 days supply | Qty: 90 | Fill #2

## 2019-09-05 MED FILL — SYNTHROID 112 MCG TABLET: 112 | 30 days supply | Qty: 30 | Fill #1

## 2019-09-05 MED FILL — LOSARTAN POTASSIUM 100 MG T: 100 | 90 days supply | Qty: 90 | Fill #2

## 2019-10-07 MED FILL — SYNTHROID 112 MCG TABLET: 112 | 30 days supply | Qty: 30 | Fill #2

## 2019-10-28 ENCOUNTER — Ambulatory Visit: Payer: 59 | Attending: Internal Medicine

## 2019-10-28 DIAGNOSIS — Z23 Encounter for immunization: Secondary | ICD-10-CM | POA: Insufficient documentation

## 2019-10-28 NOTE — Progress Notes (Signed)
   Covid-19 Vaccination Clinic  Name:  [name]    MRN: [id number] DOB: Dec 29, 1955  10/28/2019  [name] was observed post Covid-19 immunization for 15 minutes without incidence. She was provided with Vaccine Information Sheet and instruction to access the V-Safe system.   [name] was instructed to call 911 with any severe reactions post vaccine: [name] Difficulty breathing  . Swelling of your face and throat  . A fast heartbeat  . A bad rash all over your body  . Dizziness and weakness    Immunizations Administered    Name Date Dose VIS Date Route   Pfizer COVID-19 Vaccine 10/28/2019 12:05 PM 0.3 mL 08/19/2019 Intramuscular   Manufacturer: [place]   Lot: [id number]   [place]: [id number]

## 2019-11-04 MED FILL — BETAMETHASONE DP 0.05% OINT: 0.05 | 20 days supply | Qty: 45 | Fill #0

## 2019-11-07 MED FILL — SYNTHROID 112 MCG TABLET: 112 | 30 days supply | Qty: 30 | Fill #3

## 2019-11-22 ENCOUNTER — Ambulatory Visit: Payer: 59 | Attending: Internal Medicine

## 2019-12-05 DIAGNOSIS — E669 Obesity, unspecified: Secondary | ICD-10-CM | POA: Diagnosis not present

## 2019-12-05 DIAGNOSIS — E7800 Pure hypercholesterolemia, unspecified: Secondary | ICD-10-CM | POA: Diagnosis not present

## 2019-12-05 DIAGNOSIS — Z0000 Encounter for general adult medical examination without abnormal findings: Secondary | ICD-10-CM | POA: Diagnosis not present

## 2019-12-05 DIAGNOSIS — E039 Hypothyroidism, unspecified: Secondary | ICD-10-CM | POA: Diagnosis not present

## 2019-12-05 DIAGNOSIS — D251 Intramural leiomyoma of uterus: Secondary | ICD-10-CM | POA: Diagnosis not present

## 2019-12-05 DIAGNOSIS — D649 Anemia, unspecified: Secondary | ICD-10-CM | POA: Diagnosis not present

## 2019-12-05 DIAGNOSIS — I10 Essential (primary) hypertension: Secondary | ICD-10-CM | POA: Diagnosis not present

## 2019-12-05 DIAGNOSIS — I890 Lymphedema, not elsewhere classified: Secondary | ICD-10-CM | POA: Diagnosis not present

## 2019-12-05 MED FILL — HYDROCHLOROTHIAZIDE 12.5 MG: 12.5 | 90 days supply | Qty: 90 | Fill #0

## 2019-12-05 MED FILL — LOSARTAN POTASSIUM 100 MG T: 100 | 90 days supply | Qty: 90 | Fill #0

## 2019-12-06 DIAGNOSIS — E039 Hypothyroidism, unspecified: Secondary | ICD-10-CM | POA: Diagnosis not present

## 2019-12-06 DIAGNOSIS — D649 Anemia, unspecified: Secondary | ICD-10-CM | POA: Diagnosis not present

## 2019-12-06 DIAGNOSIS — I10 Essential (primary) hypertension: Secondary | ICD-10-CM | POA: Diagnosis not present

## 2019-12-07 MED FILL — SYNTHROID 112 MCG TABLET: 112 | 30 days supply | Qty: 30 | Fill #4

## 2020-01-05 DIAGNOSIS — L820 Inflamed seborrheic keratosis: Secondary | ICD-10-CM | POA: Diagnosis not present

## 2020-01-05 DIAGNOSIS — L931 Subacute cutaneous lupus erythematosus: Secondary | ICD-10-CM | POA: Diagnosis not present

## 2020-01-05 DIAGNOSIS — R21 Rash and other nonspecific skin eruption: Secondary | ICD-10-CM | POA: Diagnosis not present

## 2020-01-05 DIAGNOSIS — Z79899 Other long term (current) drug therapy: Secondary | ICD-10-CM | POA: Diagnosis not present

## 2020-01-06 MED FILL — SYNTHROID 112 MCG TABLET: 112 | 30 days supply | Qty: 30 | Fill #5

## 2020-02-07 MED FILL — SYNTHROID 112 MCG TABLET: 112 | 90 days supply | Qty: 90 | Fill #0

## 2020-02-27 MED FILL — BETAMETHASONE DP 0.05% OINT: 0.05 | 20 days supply | Qty: 45 | Fill #0

## 2020-03-05 MED FILL — LOSARTAN POTASSIUM 100 MG T: 100 | 90 days supply | Qty: 90 | Fill #1

## 2020-03-05 MED FILL — HYDROCHLOROTHIAZIDE 12.5 MG: 12.5 | 90 days supply | Qty: 90 | Fill #1

## 2020-03-16 ENCOUNTER — Other Ambulatory Visit: Payer: Self-pay | Admitting: Family Medicine

## 2020-03-16 DIAGNOSIS — Z1231 Encounter for screening mammogram for malignant neoplasm of breast: Secondary | ICD-10-CM

## 2020-04-11 DIAGNOSIS — E7800 Pure hypercholesterolemia, unspecified: Secondary | ICD-10-CM | POA: Diagnosis not present

## 2020-04-11 DIAGNOSIS — I10 Essential (primary) hypertension: Secondary | ICD-10-CM | POA: Diagnosis not present

## 2020-04-11 DIAGNOSIS — K219 Gastro-esophageal reflux disease without esophagitis: Secondary | ICD-10-CM | POA: Diagnosis not present

## 2020-04-11 DIAGNOSIS — R002 Palpitations: Secondary | ICD-10-CM | POA: Diagnosis not present

## 2020-04-11 DIAGNOSIS — Z9189 Other specified personal risk factors, not elsewhere classified: Secondary | ICD-10-CM | POA: Diagnosis not present

## 2020-04-11 MED FILL — PANTOPRAZOLE SOD DR 40 MG T: 40 | 30 days supply | Qty: 30 | Fill #0

## 2020-04-18 ENCOUNTER — Other Ambulatory Visit: Payer: Self-pay

## 2020-04-18 ENCOUNTER — Ambulatory Visit
Admission: RE | Admit: 2020-04-18 | Discharge: 2020-04-18 | Disposition: A | Discharge status: Home / Self Care | Payer: 59 | Source: Ambulatory Visit | Attending: Family Medicine | Admitting: Family Medicine

## 2020-04-18 DIAGNOSIS — Z1231 Encounter for screening mammogram for malignant neoplasm of breast: Secondary | ICD-10-CM

## 2020-05-07 MED FILL — SYNTHROID 112 MCG TABLET: 112 | 90 days supply | Qty: 90 | Fill #1

## 2020-05-08 MED FILL — BETAMETHASONE DP 0.05% OINT: 0.05 | 20 days supply | Qty: 45 | Fill #0

## 2020-06-01 ENCOUNTER — Other Ambulatory Visit (HOSPITAL_COMMUNITY): Payer: Self-pay | Admitting: Family Medicine

## 2020-06-01 MED FILL — HYDROCHLOROTHIAZIDE 12.5 MG: 12.5 | 90 days supply | Qty: 90 | Fill #0

## 2020-06-01 MED FILL — LOSARTAN POTASSIUM 100 MG T: 100 | 90 days supply | Qty: 90 | Fill #2

## 2020-06-06 DIAGNOSIS — I10 Essential (primary) hypertension: Secondary | ICD-10-CM | POA: Diagnosis not present

## 2020-06-06 DIAGNOSIS — I2510 Atherosclerotic heart disease of native coronary artery without angina pectoris: Secondary | ICD-10-CM | POA: Diagnosis not present

## 2020-06-06 DIAGNOSIS — E039 Hypothyroidism, unspecified: Secondary | ICD-10-CM | POA: Diagnosis not present

## 2020-06-06 DIAGNOSIS — Z23 Encounter for immunization: Secondary | ICD-10-CM | POA: Diagnosis not present

## 2020-06-06 DIAGNOSIS — E7800 Pure hypercholesterolemia, unspecified: Secondary | ICD-10-CM | POA: Diagnosis not present

## 2020-06-08 ENCOUNTER — Other Ambulatory Visit: Payer: Self-pay

## 2020-06-08 ENCOUNTER — Ambulatory Visit (INDEPENDENT_AMBULATORY_CARE_PROVIDER_SITE_OTHER): Payer: 59 | Admitting: Cardiology

## 2020-06-08 ENCOUNTER — Encounter: Payer: Self-pay | Admitting: Cardiology

## 2020-06-08 VITALS — BP 118/80 | HR 73 | Ht 62.0 in | Wt 212.0 lb

## 2020-06-08 DIAGNOSIS — I10 Essential (primary) hypertension: Secondary | ICD-10-CM

## 2020-06-08 NOTE — Patient Instructions (Signed)
Medication Instructions:  The current medical regimen is effective;  continue present plan and medications.  *If you need a refill on your cardiac medications before your next appointment, please call your pharmacy*  Follow-Up: Follow up as needed.  We recommend signing up for the patient portal called "MyChart".  Sign up information is provided on this After Visit Summary.  MyChart is used to connect with patients for Virtual Visits (Telemedicine).  Patients are able to view lab/test results, encounter notes, upcoming appointments, etc.  Non-urgent messages can be sent to your provider as well.   To learn more about what you can do with MyChart, go to [email or web address].    Thank you for choosing [facility]!!

## 2020-06-08 NOTE — Progress Notes (Signed)
Cardiology Office Note:    Date:  06/08/2020   ID:  [name], DOB 07-02-1956, MRN [id number]  PCP:  Kelton Pillar, MD  [facility] Cardiologist:  Candee Furbish, MD  [facility] Electrophysiologist:  None   Referring MD: Kelton Pillar, MD    History of Present Illness:    [name] is a 64 y.o. female here for the evaluation of hypertension, diaphoresis at the request of Dr. Kelton Pillar.  Does not currently have any chest pain, no shortness of breath with or without activity.  She has battled with lymphedema of her lower extremities.  She also has had Graves' disease in the past, radioactive iodine ablation.  Has been under good control.  Her hypertension currently today is under excellent control with blood pressure 118/80.  She is on losartan hydrochlorothiazide.  She has no early family history of CAD, mother is currently 76 and independent.  She has been trying to increase her exercise efforts.  She and her own words thought that she may have a "blockage "on her EKG.  Her EKG today shows sinus rhythm with subtle sinus arrhythmia.  No evidence of left or right bundle branch block.  No evidence of left anterior fascicular block.  Prior event monitor in 2015 showed PVCs only.  Benign.  She will occasionally feel palpitations to this day.  Sometimes when relaxing or driving.  She has an upcoming colonoscopy and she may proceed.    Past Medical History:  Diagnosis Date  . Allergy   . Graves disease   . High blood pressure   . Hypothyroid   . Lymphedema     Past Surgical History:  Procedure Laterality Date  . CESAREAN SECTION     x3  . UTERINE FIBROID EMBOLIZATION  2010    Current Medications: Current Meds  Medication Sig  . Calcium 600-200 MG-UNIT tablet Take 2 tablets by mouth daily.   . diphenhydrAMINE (BENADRYL) 25 mg capsule Take 25 mg by mouth every 6 (six) hours as needed.  . ferrous sulfate 325 (65 FE) MG EC tablet Take 325 mg by  mouth 3 (three) times daily with meals.  [name] levothyroxine (SYNTHROID, LEVOTHROID) 112 MCG tablet Take 112 mcg by mouth daily before breakfast.  . losartan-hydrochlorothiazide (HYZAAR) 100-12.5 MG tablet Take 1 tablet by mouth daily.     Allergies:   Codeine and Latex   Social History   Socioeconomic History  . Marital status: Married    Spouse name: [name]  . Number of children: 3  . Years of education: Not on file  . Highest education level: Not on file  Occupational History  . Not on file  Tobacco Use  . Smoking status: Never Smoker  . Smokeless tobacco: Never Used  Vaping Use  . Vaping Use: Never used  Substance and Sexual Activity  . Alcohol use: Not Currently  . Drug use: Not Currently  . Sexual activity: Yes    Partners: Male  Other Topics Concern  . Not on file  Social History Narrative  . Not on file   Social Determinants of Health   Financial Resource Strain:   . Difficulty of Paying Living Expenses: Not on file  Food Insecurity:   . Worried About Charity fundraiser in the Last Year: Not on file  . Ran Out of Food in the Last Year: Not on file  Transportation Needs:   . Lack of Transportation (Medical): Not on file  . Lack of Transportation (  Non-Medical): Not on file  Physical Activity:   . Days of Exercise per Week: Not on file  . Minutes of Exercise per Session: Not on file  Stress:   . Feeling of Stress : Not on file  Social Connections:   . Frequency of Communication with Friends and Family: Not on file  . Frequency of Social Gatherings with Friends and Family: Not on file  . Attends Religious Services: Not on file  . Active Member of Clubs or Organizations: Not on file  . Attends Archivist Meetings: Not on file  . Marital Status: Not on file     Family History: The patient's family history includes Breast cancer in her sister; Obesity in her mother; Thyroid disease in her mother.  ROS:   Please see the history of present  illness.     All other systems reviewed and are negative.  EKGs/Labs/Other Studies Reviewed:    The following studies were reviewed today: As above  EKG:  EKG is  ordered today.  The ekg ordered today demonstrates as above  Recent Labs: No results found for requested labs within last 8760 hours.  Recent Lipid Panel    Component Value Date/Time   CHOL 207 (H) 12/21/2017 1131   TRIG 78 12/21/2017 1131   HDL 53 12/21/2017 1131   LDLCALC 138 (H) 12/21/2017 1131    Physical Exam:    VS:  BP 118/80   Pulse 73   Ht 5\' 2"  (1.575 m)   Wt 212 lb (96.2 kg)   LMP 12/29/2011   SpO2 95%   BMI 38.78 kg/m     Wt Readings from Last 3 Encounters:  06/08/20 212 lb (96.2 kg)  04/13/18 194 lb (88 kg)  03/09/18 194 lb (88 kg)     GEN:  Well nourished, well developed in no acute distress HEENT: Normal NECK: No JVD; No carotid bruits LYMPHATICS: No lymphadenopathy CARDIAC: RRR, no murmurs, rubs, gallops RESPIRATORY:  Clear to auscultation without rales, wheezing or rhonchi  ABDOMEN: Soft, non-tender, non-distended MUSCULOSKELETAL: Lower extremity lymphedema noted; No deformity  SKIN: Warm and dry NEUROLOGIC:  Alert and oriented x 3 PSYCHIATRIC:  Normal affect   ASSESSMENT:    1. Primary hypertension    PLAN:    In order of problems listed above:  Palpitations -Previously diagnosed with PVCs, event monitor personally reviewed demonstrates these.  Benign.  Avoid caffeine.  Continue with exercise.  Overall doing well.  Essential hypertension -Excellent control currently with losartan hydrochlorothiazide.  Continue with this prescription.  EKG -I do not see any evidence of bundle branch block etc.  Excellent.  Prior carotid ultrasound in 2016 showed minimal left plaque.  I offered her a coronary calcium score.  If she did show signs of coronary calcification, I would advocate statin therapy such as Crestor.  Please let us know if we can be of further  assistance.   Medication Adjustments/Labs and Tests Ordered: Current medicines are reviewed at length with the patient today.  Concerns regarding medicines are outlined above.  Orders Placed This Encounter  Procedures  . EKG 12-Lead   No orders of the defined types were placed in this encounter.   Patient Instructions  Medication Instructions:  The current medical regimen is effective;  continue present plan and medications.  *If you need a refill on your cardiac medications before your next appointment, please call your pharmacy*  Follow-Up: Follow up as needed.  We recommend signing up for the patient portal called "MyChart".  Sign up information is provided on this After Visit Summary.  MyChart is used to connect with patients for Virtual Visits (Telemedicine).  Patients are able to view lab/test results, encounter notes, upcoming appointments, etc.  Non-urgent messages can be sent to your provider as well.   To learn more about what you can do with MyChart, go to [email or web address].    Thank you for choosing [facility]!!       Signed, Candee Furbish, MD  06/08/2020 12:15 PM    [place]

## 2020-07-04 DIAGNOSIS — M25561 Pain in right knee: Secondary | ICD-10-CM | POA: Diagnosis not present

## 2020-07-30 ENCOUNTER — Other Ambulatory Visit (HOSPITAL_COMMUNITY): Payer: Self-pay | Admitting: Dermatology

## 2020-07-30 MED FILL — BETAMETHASONE DP 0.05% OINT: 0.05 | 30 days supply | Qty: 45 | Fill #0

## 2020-07-31 ENCOUNTER — Other Ambulatory Visit (HOSPITAL_COMMUNITY): Payer: Self-pay | Admitting: Family Medicine

## 2020-07-31 MED FILL — SYNTHROID 112 MCG TABLET: 112 | 90 days supply | Qty: 90 | Fill #0

## 2020-08-23 ENCOUNTER — Other Ambulatory Visit (HOSPITAL_COMMUNITY): Payer: Self-pay | Admitting: Gastroenterology

## 2020-08-23 DIAGNOSIS — K219 Gastro-esophageal reflux disease without esophagitis: Secondary | ICD-10-CM | POA: Diagnosis not present

## 2020-08-23 DIAGNOSIS — Z1211 Encounter for screening for malignant neoplasm of colon: Secondary | ICD-10-CM | POA: Diagnosis not present

## 2020-08-23 DIAGNOSIS — K5900 Constipation, unspecified: Secondary | ICD-10-CM | POA: Diagnosis not present

## 2020-09-03 ENCOUNTER — Other Ambulatory Visit (HOSPITAL_COMMUNITY): Payer: Self-pay | Admitting: Family Medicine

## 2020-09-03 MED FILL — HYDROCHLOROTHIAZIDE 12.5 MG: 12.5 | 90 days supply | Qty: 90 | Fill #1

## 2020-09-03 MED FILL — LOSARTAN POTASSIUM 100 MG T: 100 | 30 days supply | Qty: 30 | Fill #0

## 2020-09-18 MED FILL — PEG-3350 SOLUTION: 420 | 1 days supply | Qty: 4000 | Fill #0

## 2020-09-20 DIAGNOSIS — Z01812 Encounter for preprocedural laboratory examination: Secondary | ICD-10-CM | POA: Diagnosis not present

## 2020-10-05 MED FILL — LOSARTAN POTASSIUM 100 MG T: 100 | 30 days supply | Qty: 30 | Fill #1

## 2020-10-09 DIAGNOSIS — Z01419 Encounter for gynecological examination (general) (routine) without abnormal findings: Secondary | ICD-10-CM | POA: Diagnosis not present

## 2020-10-09 DIAGNOSIS — R87610 Atypical squamous cells of undetermined significance on cytologic smear of cervix (ASC-US): Secondary | ICD-10-CM | POA: Diagnosis not present

## 2020-10-25 ENCOUNTER — Other Ambulatory Visit (HOSPITAL_COMMUNITY): Payer: Self-pay | Admitting: Dermatology

## 2020-10-25 MED FILL — FLUTICASONE PROP 0.005% OIN: 0.005 | 15 days supply | Qty: 30 | Fill #0

## 2020-10-25 MED FILL — BETAMETHASONE DP 0.05% OINT: 0.05 | 20 days supply | Qty: 45 | Fill #0

## 2020-11-02 MED FILL — SYNTHROID 112 MCG TABLET: 112 | 90 days supply | Qty: 90 | Fill #1

## 2020-11-02 MED FILL — LOSARTAN POTASSIUM 100 MG T: 100 | 30 days supply | Qty: 30 | Fill #2

## 2020-11-14 ENCOUNTER — Other Ambulatory Visit (HOSPITAL_COMMUNITY): Payer: Self-pay

## 2020-11-14 MED FILL — DOXYCYCLINE HYC 20 MG TAB: 20 | 30 days supply | Qty: 60 | Fill #0

## 2020-11-14 MED FILL — CHLORHEXIDINE 0.12% RINSE: 0.12 | 16 days supply | Qty: 473 | Fill #0

## 2020-11-20 DIAGNOSIS — H524 Presbyopia: Secondary | ICD-10-CM | POA: Diagnosis not present

## 2020-11-20 DIAGNOSIS — H2513 Age-related nuclear cataract, bilateral: Secondary | ICD-10-CM | POA: Diagnosis not present

## 2020-12-03 ENCOUNTER — Other Ambulatory Visit (HOSPITAL_COMMUNITY): Payer: Self-pay | Admitting: Family Medicine

## 2020-12-03 MED FILL — LOSARTAN POTASSIUM 100 MG T: 100 | 30 days supply | Qty: 30 | Fill #3

## 2020-12-03 MED FILL — HYDROCHLOROTHIAZIDE 12.5 MG: 12.5 | 90 days supply | Qty: 90 | Fill #0

## 2020-12-07 DIAGNOSIS — Z23 Encounter for immunization: Secondary | ICD-10-CM | POA: Diagnosis not present

## 2020-12-07 DIAGNOSIS — E039 Hypothyroidism, unspecified: Secondary | ICD-10-CM | POA: Diagnosis not present

## 2020-12-07 DIAGNOSIS — I10 Essential (primary) hypertension: Secondary | ICD-10-CM | POA: Diagnosis not present

## 2020-12-07 DIAGNOSIS — E7800 Pure hypercholesterolemia, unspecified: Secondary | ICD-10-CM | POA: Diagnosis not present

## 2020-12-07 DIAGNOSIS — Z1211 Encounter for screening for malignant neoplasm of colon: Secondary | ICD-10-CM | POA: Diagnosis not present

## 2020-12-07 DIAGNOSIS — Z0000 Encounter for general adult medical examination without abnormal findings: Secondary | ICD-10-CM | POA: Diagnosis not present

## 2021-01-01 ENCOUNTER — Other Ambulatory Visit (HOSPITAL_COMMUNITY): Payer: Self-pay

## 2021-01-01 MED ORDER — BETAMETHASONE DIPROPIONATE 0.05 % EX OINT
1.0000 "application " | TOPICAL_OINTMENT | Freq: Two times a day (BID) | CUTANEOUS | 0 refills | Status: DC
  Filled 2021-01-01: qty 45, 20d supply, fill #0

## 2021-01-01 MED FILL — Losartan Potassium Tab 100 MG: ORAL | 60 days supply | Qty: 60 | Fill #0 | Status: AC

## 2021-01-03 ENCOUNTER — Other Ambulatory Visit (HOSPITAL_COMMUNITY): Payer: Self-pay

## 2021-01-31 ENCOUNTER — Other Ambulatory Visit (HOSPITAL_COMMUNITY): Payer: Self-pay

## 2021-01-31 MED FILL — Levothyroxine Sodium Tab 112 MCG: ORAL | 90 days supply | Qty: 90 | Fill #0 | Status: AC

## 2021-02-11 DIAGNOSIS — Z1211 Encounter for screening for malignant neoplasm of colon: Secondary | ICD-10-CM | POA: Diagnosis not present

## 2021-02-27 ENCOUNTER — Other Ambulatory Visit (HOSPITAL_BASED_OUTPATIENT_CLINIC_OR_DEPARTMENT_OTHER): Payer: Self-pay

## 2021-02-27 ENCOUNTER — Ambulatory Visit: Payer: 59 | Attending: Internal Medicine

## 2021-02-27 DIAGNOSIS — Z23 Encounter for immunization: Secondary | ICD-10-CM

## 2021-02-27 MED ORDER — PFIZER-BIONT COVID-19 VAC-TRIS 30 MCG/0.3ML IM SUSP
INTRAMUSCULAR | 0 refills | Status: DC
  Filled 2021-02-27: qty 0.3, 1d supply, fill #0

## 2021-02-27 NOTE — Progress Notes (Signed)
   Covid-19 Vaccination Clinic  Name:  [name]    MRN: [id number] DOB: [birth year]  02/27/2021  [name] was observed post Covid-19 immunization for 15 minutes without incident. She was provided with Vaccine Information Sheet and instruction to access the V-Safe system.   [name] was instructed to call 911 with any severe reactions post vaccine: Difficulty breathing  Swelling of face and throat  A fast heartbeat  A bad rash all over body  Dizziness and weakness   Immunizations Administered     Name Date Dose VIS Date Route   PFIZER Comrnaty([place] TOP) Covid-19 Vaccine 02/27/2021 10:51 AM 0.3 mL 08/16/2020 Intramuscular   Manufacturer: [place]   Lot: [id number]   [place]: [id number]

## 2021-03-04 ENCOUNTER — Other Ambulatory Visit (HOSPITAL_COMMUNITY): Payer: Self-pay

## 2021-03-04 MED ORDER — LOSARTAN POTASSIUM 100 MG PO TABS
100.0000 mg | ORAL_TABLET | Freq: Every day | ORAL | 1 refills | Status: DC
  Filled 2021-03-04: qty 90, 90d supply, fill #0
  Filled 2021-06-03: qty 90, 90d supply, fill #1

## 2021-03-04 MED FILL — Hydrochlorothiazide Tab 12.5 MG: ORAL | 90 days supply | Qty: 90 | Fill #0 | Status: AC

## 2021-03-05 ENCOUNTER — Other Ambulatory Visit (HOSPITAL_COMMUNITY): Payer: Self-pay

## 2021-03-05 MED ORDER — BETAMETHASONE DIPROPIONATE 0.05 % EX OINT
TOPICAL_OINTMENT | CUTANEOUS | 0 refills | Status: DC
  Filled 2021-03-05: qty 45, 20d supply, fill #0

## 2021-03-06 ENCOUNTER — Other Ambulatory Visit (HOSPITAL_COMMUNITY): Payer: Self-pay

## 2021-03-29 ENCOUNTER — Other Ambulatory Visit: Payer: Self-pay | Admitting: Family Medicine

## 2021-03-29 DIAGNOSIS — Z1231 Encounter for screening mammogram for malignant neoplasm of breast: Secondary | ICD-10-CM

## 2021-04-29 ENCOUNTER — Other Ambulatory Visit (HOSPITAL_COMMUNITY): Payer: Self-pay

## 2021-04-29 MED FILL — Levothyroxine Sodium Tab 112 MCG: ORAL | 90 days supply | Qty: 90 | Fill #1 | Status: AC

## 2021-05-22 ENCOUNTER — Ambulatory Visit: Payer: 59

## 2021-05-22 ENCOUNTER — Other Ambulatory Visit: Payer: Self-pay

## 2021-05-22 ENCOUNTER — Ambulatory Visit
Admission: RE | Admit: 2021-05-22 | Discharge: 2021-05-22 | Disposition: A | Discharge status: Home / Self Care | Payer: 59 | Source: Ambulatory Visit | Attending: Family Medicine | Admitting: Family Medicine

## 2021-05-22 DIAGNOSIS — Z1231 Encounter for screening mammogram for malignant neoplasm of breast: Secondary | ICD-10-CM

## 2021-05-27 ENCOUNTER — Other Ambulatory Visit (HOSPITAL_COMMUNITY): Payer: Self-pay

## 2021-05-28 ENCOUNTER — Other Ambulatory Visit (HOSPITAL_COMMUNITY): Payer: Self-pay

## 2021-05-28 MED ORDER — FLUTICASONE PROPIONATE 0.005 % EX OINT
TOPICAL_OINTMENT | CUTANEOUS | 0 refills | Status: AC
  Filled 2021-05-28: qty 30, 7d supply, fill #0

## 2021-05-29 ENCOUNTER — Other Ambulatory Visit (HOSPITAL_COMMUNITY): Payer: Self-pay

## 2021-05-31 ENCOUNTER — Other Ambulatory Visit (HOSPITAL_COMMUNITY): Payer: Self-pay

## 2021-06-03 ENCOUNTER — Other Ambulatory Visit (HOSPITAL_COMMUNITY): Payer: Self-pay

## 2021-06-03 MED ORDER — BETAMETHASONE DIPROPIONATE 0.05 % EX OINT
1.0000 "application " | TOPICAL_OINTMENT | Freq: Two times a day (BID) | CUTANEOUS | 0 refills | Status: DC
  Filled 2021-06-03: qty 45, 10d supply, fill #0

## 2021-06-04 ENCOUNTER — Other Ambulatory Visit (HOSPITAL_COMMUNITY): Payer: Self-pay

## 2021-06-05 ENCOUNTER — Other Ambulatory Visit (HOSPITAL_COMMUNITY): Payer: Self-pay

## 2021-06-10 ENCOUNTER — Other Ambulatory Visit (HOSPITAL_COMMUNITY): Payer: Self-pay

## 2021-06-11 ENCOUNTER — Other Ambulatory Visit (HOSPITAL_COMMUNITY): Payer: Self-pay

## 2021-06-12 ENCOUNTER — Other Ambulatory Visit (HOSPITAL_COMMUNITY): Payer: Self-pay

## 2021-06-17 ENCOUNTER — Other Ambulatory Visit (HOSPITAL_COMMUNITY): Payer: Self-pay

## 2021-06-19 ENCOUNTER — Other Ambulatory Visit (HOSPITAL_COMMUNITY): Payer: Self-pay

## 2021-06-20 ENCOUNTER — Other Ambulatory Visit (HOSPITAL_COMMUNITY): Payer: Self-pay

## 2021-06-20 MED ORDER — HYDROCHLOROTHIAZIDE 12.5 MG PO TABS
12.5000 mg | ORAL_TABLET | Freq: Every morning | ORAL | 1 refills | Status: DC
  Filled 2021-06-20: qty 90, 90d supply, fill #0
  Filled 2021-09-15: qty 90, 90d supply, fill #1

## 2021-06-21 ENCOUNTER — Other Ambulatory Visit (HOSPITAL_COMMUNITY): Payer: Self-pay

## 2021-06-22 DIAGNOSIS — Z23 Encounter for immunization: Secondary | ICD-10-CM | POA: Diagnosis not present

## 2021-07-16 ENCOUNTER — Other Ambulatory Visit (HOSPITAL_COMMUNITY): Payer: Self-pay

## 2021-07-17 ENCOUNTER — Other Ambulatory Visit (HOSPITAL_COMMUNITY): Payer: Self-pay

## 2021-07-17 MED ORDER — BETAMETHASONE DIPROPIONATE 0.05 % EX OINT
1.0000 "application " | TOPICAL_OINTMENT | Freq: Two times a day (BID) | CUTANEOUS | 0 refills | Status: DC
  Filled 2021-07-17 (×2): qty 15, 7d supply, fill #0

## 2021-07-18 ENCOUNTER — Other Ambulatory Visit (HOSPITAL_COMMUNITY): Payer: Self-pay

## 2021-07-25 ENCOUNTER — Other Ambulatory Visit (HOSPITAL_COMMUNITY): Payer: Self-pay

## 2021-07-25 DIAGNOSIS — E669 Obesity, unspecified: Secondary | ICD-10-CM | POA: Diagnosis not present

## 2021-07-25 DIAGNOSIS — E039 Hypothyroidism, unspecified: Secondary | ICD-10-CM | POA: Diagnosis not present

## 2021-07-25 DIAGNOSIS — I10 Essential (primary) hypertension: Secondary | ICD-10-CM | POA: Diagnosis not present

## 2021-07-25 DIAGNOSIS — D649 Anemia, unspecified: Secondary | ICD-10-CM | POA: Diagnosis not present

## 2021-07-25 DIAGNOSIS — E7800 Pure hypercholesterolemia, unspecified: Secondary | ICD-10-CM | POA: Diagnosis not present

## 2021-07-25 MED ORDER — LOSARTAN POTASSIUM 100 MG PO TABS
100.0000 mg | ORAL_TABLET | Freq: Every day | ORAL | 3 refills | Status: DC
  Filled 2021-07-25 – 2021-08-26 (×2): qty 90, 90d supply, fill #0
  Filled 2021-11-24: qty 90, 90d supply, fill #1
  Filled 2022-03-03: qty 90, 90d supply, fill #2
  Filled 2022-05-29: qty 90, 90d supply, fill #3

## 2021-07-29 ENCOUNTER — Other Ambulatory Visit (HOSPITAL_COMMUNITY): Payer: Self-pay

## 2021-07-29 MED ORDER — LEVOTHYROXINE SODIUM 112 MCG PO TABS
112.0000 ug | ORAL_TABLET | Freq: Every morning | ORAL | 1 refills | Status: DC
  Filled 2021-07-29: qty 90, 90d supply, fill #0
  Filled 2021-10-28: qty 90, 90d supply, fill #1

## 2021-07-31 ENCOUNTER — Other Ambulatory Visit (HOSPITAL_COMMUNITY): Payer: Self-pay

## 2021-08-19 ENCOUNTER — Other Ambulatory Visit (HOSPITAL_COMMUNITY): Payer: Self-pay

## 2021-08-20 ENCOUNTER — Other Ambulatory Visit (HOSPITAL_COMMUNITY): Payer: Self-pay

## 2021-08-21 ENCOUNTER — Other Ambulatory Visit (HOSPITAL_COMMUNITY): Payer: Self-pay

## 2021-08-22 ENCOUNTER — Other Ambulatory Visit (HOSPITAL_COMMUNITY): Payer: Self-pay

## 2021-08-23 ENCOUNTER — Other Ambulatory Visit (HOSPITAL_COMMUNITY): Payer: Self-pay

## 2021-08-26 ENCOUNTER — Other Ambulatory Visit (HOSPITAL_COMMUNITY): Payer: Self-pay

## 2021-08-28 ENCOUNTER — Other Ambulatory Visit (HOSPITAL_COMMUNITY): Payer: Self-pay

## 2021-08-28 MED ORDER — BETAMETHASONE DIPROPIONATE 0.05 % EX OINT
1.0000 "application " | TOPICAL_OINTMENT | Freq: Two times a day (BID) | CUTANEOUS | 0 refills | Status: DC
  Filled 2021-08-28 (×2): qty 15, 7d supply, fill #0

## 2021-09-16 ENCOUNTER — Other Ambulatory Visit (HOSPITAL_COMMUNITY): Payer: Self-pay

## 2021-09-18 ENCOUNTER — Other Ambulatory Visit (HOSPITAL_COMMUNITY): Payer: Self-pay

## 2021-09-23 ENCOUNTER — Other Ambulatory Visit (HOSPITAL_COMMUNITY): Payer: Self-pay

## 2021-09-23 DIAGNOSIS — L309 Dermatitis, unspecified: Secondary | ICD-10-CM | POA: Diagnosis not present

## 2021-09-23 DIAGNOSIS — L738 Other specified follicular disorders: Secondary | ICD-10-CM | POA: Diagnosis not present

## 2021-09-23 MED ORDER — TACROLIMUS 0.1 % EX OINT
1.0000 "application " | TOPICAL_OINTMENT | Freq: Two times a day (BID) | CUTANEOUS | 2 refills | Status: DC | PRN
  Filled 2021-09-23: qty 30, 15d supply, fill #0
  Filled 2021-12-16: qty 30, 15d supply, fill #1

## 2021-09-30 ENCOUNTER — Other Ambulatory Visit (HOSPITAL_COMMUNITY): Payer: Self-pay

## 2021-10-14 DIAGNOSIS — Z01419 Encounter for gynecological examination (general) (routine) without abnormal findings: Secondary | ICD-10-CM | POA: Diagnosis not present

## 2021-10-21 DIAGNOSIS — M79671 Pain in right foot: Secondary | ICD-10-CM | POA: Diagnosis not present

## 2021-10-21 DIAGNOSIS — G5712 Meralgia paresthetica, left lower limb: Secondary | ICD-10-CM | POA: Diagnosis not present

## 2021-10-25 ENCOUNTER — Other Ambulatory Visit (HOSPITAL_COMMUNITY): Payer: Self-pay

## 2021-10-28 ENCOUNTER — Other Ambulatory Visit (HOSPITAL_COMMUNITY): Payer: Self-pay

## 2021-11-12 ENCOUNTER — Other Ambulatory Visit (HOSPITAL_COMMUNITY): Payer: Self-pay

## 2021-11-12 MED ORDER — HYDROCODONE-ACETAMINOPHEN 5-325 MG PO TABS
1.0000 | ORAL_TABLET | ORAL | 0 refills | Status: AC | PRN
Start: 2021-11-12 — End: ?
  Filled 2021-11-12: qty 12, 2d supply, fill #0

## 2021-11-13 ENCOUNTER — Other Ambulatory Visit (HOSPITAL_COMMUNITY): Payer: Self-pay

## 2021-11-13 MED ORDER — BETAMETHASONE DIPROPIONATE 0.05 % EX OINT
1.0000 "application " | TOPICAL_OINTMENT | Freq: Two times a day (BID) | CUTANEOUS | 0 refills | Status: DC | PRN
  Filled 2021-11-13: qty 15, 10d supply, fill #0

## 2021-11-14 ENCOUNTER — Other Ambulatory Visit (HOSPITAL_COMMUNITY): Payer: Self-pay

## 2021-11-25 ENCOUNTER — Other Ambulatory Visit (HOSPITAL_COMMUNITY): Payer: Self-pay

## 2021-11-25 DIAGNOSIS — H2513 Age-related nuclear cataract, bilateral: Secondary | ICD-10-CM | POA: Diagnosis not present

## 2021-11-25 DIAGNOSIS — H524 Presbyopia: Secondary | ICD-10-CM | POA: Diagnosis not present

## 2021-11-29 ENCOUNTER — Other Ambulatory Visit (HOSPITAL_COMMUNITY): Payer: Self-pay

## 2021-12-11 DIAGNOSIS — R2232 Localized swelling, mass and lump, left upper limb: Secondary | ICD-10-CM | POA: Diagnosis not present

## 2021-12-16 ENCOUNTER — Other Ambulatory Visit (HOSPITAL_COMMUNITY): Payer: Self-pay

## 2021-12-16 MED ORDER — HYDROCHLOROTHIAZIDE 12.5 MG PO TABS
12.5000 mg | ORAL_TABLET | Freq: Every morning | ORAL | 1 refills | Status: DC
  Filled 2021-12-16: qty 77, 77d supply, fill #0
  Filled 2021-12-16: qty 13, 13d supply, fill #0
  Filled 2022-03-19: qty 90, 90d supply, fill #1

## 2021-12-17 DIAGNOSIS — R2232 Localized swelling, mass and lump, left upper limb: Secondary | ICD-10-CM | POA: Diagnosis not present

## 2021-12-25 ENCOUNTER — Other Ambulatory Visit (HOSPITAL_COMMUNITY): Payer: Self-pay

## 2021-12-26 ENCOUNTER — Other Ambulatory Visit (HOSPITAL_COMMUNITY): Payer: Self-pay

## 2021-12-26 MED ORDER — BETAMETHASONE DIPROPIONATE 0.05 % EX OINT
1.0000 "application " | TOPICAL_OINTMENT | Freq: Two times a day (BID) | CUTANEOUS | 0 refills | Status: DC
  Filled 2021-12-26: qty 15, 10d supply, fill #0

## 2021-12-27 ENCOUNTER — Other Ambulatory Visit: Payer: Self-pay | Admitting: Family Medicine

## 2021-12-27 DIAGNOSIS — Z0000 Encounter for general adult medical examination without abnormal findings: Secondary | ICD-10-CM | POA: Diagnosis not present

## 2021-12-27 DIAGNOSIS — L309 Dermatitis, unspecified: Secondary | ICD-10-CM | POA: Diagnosis not present

## 2021-12-27 DIAGNOSIS — E669 Obesity, unspecified: Secondary | ICD-10-CM | POA: Diagnosis not present

## 2021-12-27 DIAGNOSIS — Z13820 Encounter for screening for osteoporosis: Secondary | ICD-10-CM

## 2021-12-27 DIAGNOSIS — E039 Hypothyroidism, unspecified: Secondary | ICD-10-CM | POA: Diagnosis not present

## 2021-12-27 DIAGNOSIS — I10 Essential (primary) hypertension: Secondary | ICD-10-CM | POA: Diagnosis not present

## 2021-12-27 DIAGNOSIS — K219 Gastro-esophageal reflux disease without esophagitis: Secondary | ICD-10-CM | POA: Diagnosis not present

## 2021-12-27 DIAGNOSIS — D649 Anemia, unspecified: Secondary | ICD-10-CM | POA: Diagnosis not present

## 2021-12-27 DIAGNOSIS — I2510 Atherosclerotic heart disease of native coronary artery without angina pectoris: Secondary | ICD-10-CM | POA: Diagnosis not present

## 2021-12-27 DIAGNOSIS — Z23 Encounter for immunization: Secondary | ICD-10-CM | POA: Diagnosis not present

## 2021-12-27 DIAGNOSIS — E7800 Pure hypercholesterolemia, unspecified: Secondary | ICD-10-CM | POA: Diagnosis not present

## 2021-12-30 ENCOUNTER — Other Ambulatory Visit (HOSPITAL_COMMUNITY): Payer: Self-pay

## 2021-12-31 ENCOUNTER — Other Ambulatory Visit (HOSPITAL_COMMUNITY): Payer: Self-pay

## 2021-12-31 ENCOUNTER — Other Ambulatory Visit: Payer: Self-pay | Admitting: Family Medicine

## 2021-12-31 DIAGNOSIS — Z13820 Encounter for screening for osteoporosis: Secondary | ICD-10-CM

## 2021-12-31 MED ORDER — ATORVASTATIN CALCIUM 10 MG PO TABS
10.0000 mg | ORAL_TABLET | Freq: Every day | ORAL | 1 refills | Status: DC
Start: 2021-12-31 — End: 2022-04-25
  Filled 2021-12-31: qty 90, 90d supply, fill #0
  Filled 2022-04-07: qty 90, 90d supply, fill #1

## 2022-01-02 ENCOUNTER — Other Ambulatory Visit: Payer: 59

## 2022-01-14 ENCOUNTER — Other Ambulatory Visit (HOSPITAL_COMMUNITY): Payer: Self-pay

## 2022-01-15 ENCOUNTER — Other Ambulatory Visit (HOSPITAL_COMMUNITY): Payer: Self-pay

## 2022-01-15 MED ORDER — BETAMETHASONE DIPROPIONATE 0.05 % EX OINT
1.0000 "application " | TOPICAL_OINTMENT | Freq: Two times a day (BID) | CUTANEOUS | 0 refills | Status: DC
  Filled 2022-01-15: qty 45, 15d supply, fill #0

## 2022-01-15 MED ORDER — AMOXICILLIN 500 MG PO CAPS
500.0000 mg | ORAL_CAPSULE | Freq: Three times a day (TID) | ORAL | 0 refills | Status: DC
  Filled 2022-01-15: qty 21, 7d supply, fill #0

## 2022-01-16 DIAGNOSIS — M79671 Pain in right foot: Secondary | ICD-10-CM | POA: Diagnosis not present

## 2022-01-16 DIAGNOSIS — M2021 Hallux rigidus, right foot: Secondary | ICD-10-CM | POA: Diagnosis not present

## 2022-01-17 ENCOUNTER — Other Ambulatory Visit (HOSPITAL_COMMUNITY): Payer: Self-pay

## 2022-01-27 ENCOUNTER — Other Ambulatory Visit (HOSPITAL_COMMUNITY): Payer: Self-pay

## 2022-01-27 MED ORDER — LEVOTHYROXINE SODIUM 112 MCG PO TABS
112.0000 ug | ORAL_TABLET | Freq: Every morning | ORAL | 0 refills | Status: DC
  Filled 2022-01-27: qty 90, 90d supply, fill #0

## 2022-01-29 ENCOUNTER — Encounter: Payer: Self-pay | Admitting: *Deleted

## 2022-02-05 ENCOUNTER — Ambulatory Visit: Payer: 59 | Admitting: Diagnostic Neuroimaging

## 2022-02-05 ENCOUNTER — Encounter: Payer: Self-pay | Admitting: Diagnostic Neuroimaging

## 2022-02-05 VITALS — BP 145/92 | HR 78 | Ht 61.0 in | Wt 212.0 lb

## 2022-02-05 DIAGNOSIS — G5713 Meralgia paresthetica, bilateral lower limbs: Secondary | ICD-10-CM | POA: Diagnosis not present

## 2022-02-05 NOTE — Progress Notes (Signed)
[facility]  PATIENT: [name] DOB: May 24, 1956  REFERRING CLINICIAN: Kelton Pillar, MD HISTORY FROM: patient  REASON FOR VISIT: new consult    HISTORICAL  CHIEF COMPLAINT:  Chief Complaint  Patient presents with   Numbness    Rm 6 alone Pt is well, is having burning/tingling bilaterally in thighs down to knees for about 6 months.     HISTORY OF PRESENT ILLNESS:   66 year old female here for evaluation of thigh numbness.  Symptoms started about 6 months ago.  Initially started in left thigh, now affecting the right thigh.  Patient had some weight gain over [place] pandemic.  Symptoms are worse when she lays down and better when she is up and active and moving around.  Symptoms are intermittent.   REVIEW OF SYSTEMS: Full 14 system review of systems performed and negative with exception of: as per HPI.  ALLERGIES: Allergies  Allergen Reactions   Codeine Nausea Only   Latex Hives and Itching    HOME MEDICATIONS: Outpatient Medications Prior to Visit  Medication Sig Dispense Refill   atorvastatin (LIPITOR) 10 MG tablet Take 1 tablet (10 mg total) by mouth daily. 90 tablet 1   betamethasone dipropionate (DIPROLENE) 0.05 % ointment Apply 1 application topically 2 (two) times daily to affected area on the skin to arms,back & hands for 7 to 10 days then as needed 45 g 0   Calcium 600-200 MG-UNIT tablet Take 2 tablets by mouth daily.      COVID-19 mRNA Vac-TriS, Pfizer, (PFIZER-BIONT COVID-19 VAC-TRIS) SUSP injection Inject into the muscle. 0.3 mL 0   diphenhydrAMINE (BENADRYL) 25 mg capsule Take 25 mg by mouth every 6 (six) hours as needed.     ferrous sulfate 325 (65 FE) MG EC tablet Take 325 mg by mouth 3 (three) times daily with meals.     fluticasone (CUTIVATE) 0.005 % ointment APPLY TO RASH ON THE SKIN TWO TIMES DAILY FOR 5 TO 7 DAYS 30 g 0   levothyroxine (SYNTHROID) 112 MCG tablet Take 1 tablet (112 mcg total) by mouth in the morning on an empty  stomach. 90 tablet 0   levothyroxine (SYNTHROID, LEVOTHROID) 112 MCG tablet Take 112 mcg by mouth daily before breakfast.     losartan (COZAAR) 100 MG tablet TAKE 1 TABLET BY MOUTH ONCE DAILY 90 tablet 1   losartan (COZAAR) 100 MG tablet Take 1 tablet (100 mg total) by mouth daily. 90 tablet 3   tacrolimus (PROTOPIC) 0.1 % ointment Apply a small amount topically 2 (two) times daily as needed to areas of rash flares 30 g 2   amoxicillin (AMOXIL) 500 MG capsule Take 1 capsule (500 mg total) by mouth 3 (three) times daily. (Patient not taking: Reported on 02/05/2022) 21 capsule 0   betamethasone dipropionate (DIPROLENE) 0.05 % ointment Apply 1 application (a small amount) topically to affected area 2 (two) times daily as needed for 7-10 days (Patient not taking: Reported on 02/05/2022) 15 g 0   betamethasone dipropionate (DIPROLENE) 0.05 % ointment APPLY TOPICALLY TO AFFECTED AREA ON THE SKIN TO ARMS, BACK, AND HANDS TWICE DAILY FOR 7 TO 10 DAYS THEN AS NEEDED. (Patient not taking: Reported on 02/05/2022) 15 g 0   hydrochlorothiazide (HYDRODIURIL) 12.5 MG tablet TAKE 1 TABLET BY MOUTH IN THE MORNING ONCE DAILY. 90 tablet 1   hydrochlorothiazide (HYDRODIURIL) 12.5 MG tablet Take 1 tablet (12.5 mg total) by mouth in the morning. (Patient not taking: Reported on 02/05/2022) 90 tablet 1  HYDROcodone-acetaminophen (NORCO/VICODIN) 5-325 MG tablet Take 1-2 tablets by mouth every 4 (four) hours as needed. (Max of 8 tablets per day). (Patient not taking: Reported on 02/05/2022) 12 tablet 0   No facility-administered medications prior to visit.    PAST MEDICAL HISTORY: Past Medical History:  Diagnosis Date   Allergy    Graves disease    High blood pressure    Hypercholesterolemia    Hypothyroid    Lymphedema     PAST SURGICAL HISTORY: Past Surgical History:  Procedure Laterality Date   ABLATION  2007   for Graves   CESAREAN SECTION     x3   TUBAL LIGATION Bilateral    UTERINE FIBROID EMBOLIZATION   2010    FAMILY HISTORY: Family History  Problem Relation Age of Onset   Hypertension Mother    Thyroid disease Mother    Obesity Mother    Berenice Primas' disease Mother    Hypertension Father    Hypertension Sister    Breast cancer Sister    Breast cancer Sister    Tuberculosis Maternal Grandmother    Hypertension Maternal Grandfather     SOCIAL HISTORY: Social History   Socioeconomic History   Marital status: Married    Spouse name: Jameriah Trotti   Number of children: 3   Years of education: Not on file   Highest education level: Not on file  Occupational History   Not on file  Tobacco Use   Smoking status: Never   Smokeless tobacco: Never  Vaping Use   Vaping Use: Never used  Substance and Sexual Activity   Alcohol use: Not Currently   Drug use: Not Currently   Sexual activity: Yes    Partners: Male  Other Topics Concern   Not on file  Social History Narrative   Lives with husband   Occas caffeine   Social Determinants of Health   Financial Resource Strain: Not on file  Food Insecurity: Not on file  Transportation Needs: Not on file  Physical Activity: Not on file  Stress: Not on file  Social Connections: Not on file  Intimate Partner Violence: Not on file     PHYSICAL EXAM  GENERAL EXAM/CONSTITUTIONAL: Vitals:  Vitals:   02/05/22 0926  BP: (!) 145/92  Pulse: 78  Weight: 212 lb (96.2 kg)  Height: '5\' 1"'$  (1.549 m)   Body mass index is 40.06 kg/m. Wt Readings from Last 3 Encounters:  02/05/22 212 lb (96.2 kg)  06/08/20 212 lb (96.2 kg)  04/13/18 194 lb (88 kg)   Patient is in no distress; well developed, nourished and groomed; neck is supple  CARDIOVASCULAR: Examination of carotid arteries is normal; no carotid bruits Regular rate and rhythm, no murmurs Examination of peripheral vascular system by observation and palpation is normal  EYES: Ophthalmoscopic exam of optic discs and posterior segments is normal; no papilledema or hemorrhages No  results found.  MUSCULOSKELETAL: Gait, strength, tone, movements noted in Neurologic exam below  NEUROLOGIC: MENTAL STATUS:      View : No data to display.         awake, alert, oriented to person, place and time recent and remote memory intact normal attention and concentration language fluent, comprehension intact, naming intact fund of knowledge appropriate  CRANIAL NERVE:  2nd - no papilledema on fundoscopic exam 2nd, 3rd, 4th, 6th - pupils equal and reactive to light, visual fields full to confrontation, extraocular muscles intact, no nystagmus 5th - facial sensation symmetric 7th - facial strength symmetric 8th -  hearing intact 9th - palate elevates symmetrically, uvula midline 11th - shoulder shrug symmetric 12th - tongue protrusion midline  MOTOR:  normal bulk and tone, full strength in the BUE, BLE  SENSORY:  normal and symmetric to light touch, temperature, vibration  COORDINATION:  finger-nose-finger, fine finger movements normal  REFLEXES:  deep tendon reflexes 1+ and symmetric  GAIT/STATION:  narrow based gait     DIAGNOSTIC DATA (LABS, IMAGING, TESTING) - I reviewed patient records, labs, notes, testing and imaging myself where available.  Lab Results  Component Value Date   WBC 3.9 12/21/2017   HGB 13.0 12/21/2017   HCT 38.0 12/21/2017   MCV 87 12/21/2017   PLT 229 11/21/2008      Component Value Date/Time   NA 141 12/21/2017 1131   K 4.3 12/21/2017 1131   CL 101 12/21/2017 1131   CO2 26 12/21/2017 1131   GLUCOSE 93 12/21/2017 1131   BUN 14 12/21/2017 1131   CREATININE 0.99 12/21/2017 1131   CALCIUM 9.1 12/21/2017 1131   PROT 6.9 12/21/2017 1131   ALBUMIN 4.1 12/21/2017 1131   AST 18 12/21/2017 1131   ALT 16 12/21/2017 1131   ALKPHOS 86 12/21/2017 1131   BILITOT 0.2 12/21/2017 1131   GFRNONAA 63 12/21/2017 1131   GFRAA 72 12/21/2017 1131   Lab Results  Component Value Date   CHOL 207 (H) 12/21/2017   HDL 53 12/21/2017    LDLCALC 138 (H) 12/21/2017   TRIG 78 12/21/2017   Lab Results  Component Value Date   HGBA1C 6.0 (H) 12/21/2017   Lab Results  Component Value Date   VITAMINB12 504 12/21/2017   Lab Results  Component Value Date   TSH 0.592 12/21/2017       ASSESSMENT AND PLAN  66 y.o. year old female here with\:   Dx:  1. Meralgia paresthetica of both lower extremities     PLAN:  MERALGIA PARESTHETICA (bilateral left > right; likely related to body habitus) - continue to optimize nutrition and exercise - encouraged hip mobility and core strengthening exercises - mild weight loss encouraged; consider other options with PCP  Return for return to PCP.  I spent 30 minutes of face-to-face and non-face-to-face time with patient.  This included previsit chart review, lab review, study review, order entry, electronic health record documentation, patient education.     Penni Bombard, MD 9/60/4540, 98:11 AM Certified in Neurology, Neurophysiology and Neuroimaging  [facility] [address] [place], [place] [address] [phone]

## 2022-02-05 NOTE — Patient Instructions (Signed)
MERALGIA PARESTHETICA - continue to optimize nutrition and exercise - encouraged hip mobility and core strengthening exercises - mild weight loss encouraged; consider other options with PCP

## 2022-03-03 ENCOUNTER — Other Ambulatory Visit (HOSPITAL_COMMUNITY): Payer: Self-pay

## 2022-03-19 ENCOUNTER — Other Ambulatory Visit (HOSPITAL_COMMUNITY): Payer: Self-pay

## 2022-03-20 ENCOUNTER — Other Ambulatory Visit (HOSPITAL_COMMUNITY): Payer: Self-pay

## 2022-03-20 DIAGNOSIS — L568 Other specified acute skin changes due to ultraviolet radiation: Secondary | ICD-10-CM | POA: Diagnosis not present

## 2022-03-20 DIAGNOSIS — L668 Other cicatricial alopecia: Secondary | ICD-10-CM | POA: Diagnosis not present

## 2022-03-20 DIAGNOSIS — L930 Discoid lupus erythematosus: Secondary | ICD-10-CM | POA: Diagnosis not present

## 2022-03-20 MED ORDER — FLUOCINOLONE ACETONIDE BODY 0.01 % EX OIL
1.0000 | TOPICAL_OIL | Freq: Every day | CUTANEOUS | 3 refills | Status: DC
  Filled 2022-03-20: qty 118.28, 30d supply, fill #0

## 2022-03-20 MED ORDER — BETAMETHASONE DIPROPIONATE 0.05 % EX OINT
1.0000 "application " | TOPICAL_OINTMENT | Freq: Two times a day (BID) | CUTANEOUS | 1 refills | Status: DC
  Filled 2022-03-20: qty 45, 30d supply, fill #0
  Filled 2022-04-21: qty 45, 30d supply, fill #1

## 2022-04-07 ENCOUNTER — Other Ambulatory Visit (HOSPITAL_COMMUNITY): Payer: Self-pay

## 2022-04-14 ENCOUNTER — Other Ambulatory Visit (HOSPITAL_COMMUNITY): Payer: Self-pay

## 2022-04-21 ENCOUNTER — Other Ambulatory Visit (HOSPITAL_COMMUNITY): Payer: Self-pay

## 2022-04-22 ENCOUNTER — Other Ambulatory Visit (HOSPITAL_COMMUNITY): Payer: Self-pay

## 2022-04-24 ENCOUNTER — Other Ambulatory Visit (HOSPITAL_COMMUNITY): Payer: Self-pay

## 2022-04-25 ENCOUNTER — Other Ambulatory Visit: Payer: Self-pay | Admitting: Internal Medicine

## 2022-04-25 ENCOUNTER — Other Ambulatory Visit (HOSPITAL_COMMUNITY): Payer: Self-pay

## 2022-04-25 ENCOUNTER — Other Ambulatory Visit: Payer: Self-pay | Admitting: Family Medicine

## 2022-04-25 DIAGNOSIS — Z1231 Encounter for screening mammogram for malignant neoplasm of breast: Secondary | ICD-10-CM

## 2022-04-25 MED ORDER — ATORVASTATIN CALCIUM 10 MG PO TABS
10.0000 mg | ORAL_TABLET | Freq: Every day | ORAL | 12 refills | Status: DC
  Filled 2022-04-25 – 2022-07-18 (×2): qty 90, 90d supply, fill #0
  Filled 2022-10-23: qty 90, 90d supply, fill #1
  Filled 2023-01-24: qty 90, 90d supply, fill #2

## 2022-04-28 ENCOUNTER — Other Ambulatory Visit (HOSPITAL_COMMUNITY): Payer: Self-pay

## 2022-04-28 MED ORDER — LEVOTHYROXINE SODIUM 112 MCG PO TABS
112.0000 ug | ORAL_TABLET | Freq: Every morning | ORAL | 1 refills | Status: DC
  Filled 2022-04-28: qty 90, 90d supply, fill #0
  Filled 2022-07-20: qty 90, 90d supply, fill #1

## 2022-05-21 ENCOUNTER — Other Ambulatory Visit (HOSPITAL_COMMUNITY): Payer: Self-pay

## 2022-05-23 ENCOUNTER — Ambulatory Visit: Payer: 59

## 2022-05-29 ENCOUNTER — Other Ambulatory Visit (HOSPITAL_COMMUNITY): Payer: Self-pay

## 2022-06-05 ENCOUNTER — Ambulatory Visit
Admission: RE | Admit: 2022-06-05 | Discharge: 2022-06-05 | Disposition: A | Discharge status: Home / Self Care | Payer: 59 | Source: Ambulatory Visit | Attending: Internal Medicine | Admitting: Internal Medicine

## 2022-06-05 DIAGNOSIS — Z1231 Encounter for screening mammogram for malignant neoplasm of breast: Secondary | ICD-10-CM | POA: Diagnosis not present

## 2022-06-07 DIAGNOSIS — Z23 Encounter for immunization: Secondary | ICD-10-CM | POA: Diagnosis not present

## 2022-06-17 ENCOUNTER — Other Ambulatory Visit (HOSPITAL_COMMUNITY): Payer: Self-pay

## 2022-06-17 MED ORDER — BETAMETHASONE DIPROPIONATE 0.05 % EX OINT
1.0000 "application " | TOPICAL_OINTMENT | Freq: Two times a day (BID) | CUTANEOUS | 0 refills | Status: DC
  Filled 2022-06-17: qty 45, 30d supply, fill #0

## 2022-06-17 MED ORDER — HYDROCHLOROTHIAZIDE 12.5 MG PO TABS
12.5000 mg | ORAL_TABLET | Freq: Every morning | ORAL | 0 refills | Status: DC
  Filled 2022-06-17: qty 90, 90d supply, fill #0

## 2022-06-24 ENCOUNTER — Other Ambulatory Visit: Payer: 59

## 2022-06-26 ENCOUNTER — Other Ambulatory Visit (HOSPITAL_COMMUNITY): Payer: Self-pay

## 2022-06-26 DIAGNOSIS — R635 Abnormal weight gain: Secondary | ICD-10-CM | POA: Diagnosis not present

## 2022-06-26 DIAGNOSIS — I10 Essential (primary) hypertension: Secondary | ICD-10-CM | POA: Diagnosis not present

## 2022-06-26 DIAGNOSIS — I890 Lymphedema, not elsewhere classified: Secondary | ICD-10-CM | POA: Diagnosis not present

## 2022-06-26 DIAGNOSIS — E669 Obesity, unspecified: Secondary | ICD-10-CM | POA: Diagnosis not present

## 2022-06-26 DIAGNOSIS — I2510 Atherosclerotic heart disease of native coronary artery without angina pectoris: Secondary | ICD-10-CM | POA: Diagnosis not present

## 2022-06-26 DIAGNOSIS — E7800 Pure hypercholesterolemia, unspecified: Secondary | ICD-10-CM | POA: Diagnosis not present

## 2022-06-26 DIAGNOSIS — Z6839 Body mass index (BMI) 39.0-39.9, adult: Secondary | ICD-10-CM | POA: Diagnosis not present

## 2022-06-26 DIAGNOSIS — E039 Hypothyroidism, unspecified: Secondary | ICD-10-CM | POA: Diagnosis not present

## 2022-06-26 DIAGNOSIS — Z23 Encounter for immunization: Secondary | ICD-10-CM | POA: Diagnosis not present

## 2022-06-26 MED ORDER — HYDROCHLOROTHIAZIDE 25 MG PO TABS
25.0000 mg | ORAL_TABLET | Freq: Every morning | ORAL | 1 refills | Status: DC
  Filled 2022-06-26: qty 90, 90d supply, fill #0
  Filled 2022-11-10: qty 90, 90d supply, fill #1

## 2022-07-18 ENCOUNTER — Other Ambulatory Visit (HOSPITAL_COMMUNITY): Payer: Self-pay

## 2022-07-21 ENCOUNTER — Other Ambulatory Visit (HOSPITAL_COMMUNITY): Payer: Self-pay

## 2022-07-23 ENCOUNTER — Other Ambulatory Visit (HOSPITAL_COMMUNITY): Payer: Self-pay

## 2022-07-23 DIAGNOSIS — R300 Dysuria: Secondary | ICD-10-CM | POA: Diagnosis not present

## 2022-07-23 DIAGNOSIS — I10 Essential (primary) hypertension: Secondary | ICD-10-CM | POA: Diagnosis not present

## 2022-07-23 MED ORDER — CIPROFLOXACIN HCL 500 MG PO TABS
500.0000 mg | ORAL_TABLET | Freq: Two times a day (BID) | ORAL | 0 refills | Status: DC
  Filled 2022-07-23: qty 6, 3d supply, fill #0

## 2022-07-24 ENCOUNTER — Other Ambulatory Visit (HOSPITAL_COMMUNITY): Payer: Self-pay

## 2022-07-24 MED ORDER — SULFAMETHOXAZOLE-TRIMETHOPRIM 800-160 MG PO TABS
1.0000 | ORAL_TABLET | Freq: Two times a day (BID) | ORAL | 3 refills | Status: DC
  Filled 2022-07-24: qty 6, 3d supply, fill #0

## 2022-08-07 ENCOUNTER — Other Ambulatory Visit (HOSPITAL_COMMUNITY): Payer: Self-pay

## 2022-08-07 MED ORDER — FLUCONAZOLE 150 MG PO TABS
150.0000 mg | ORAL_TABLET | Freq: Once | ORAL | 0 refills | Status: AC
  Filled 2022-08-07: qty 1, 1d supply, fill #0

## 2022-08-08 ENCOUNTER — Other Ambulatory Visit (HOSPITAL_COMMUNITY): Payer: Self-pay

## 2022-08-08 DIAGNOSIS — N898 Other specified noninflammatory disorders of vagina: Secondary | ICD-10-CM | POA: Diagnosis not present

## 2022-08-08 DIAGNOSIS — T490X5A Adverse effect of local antifungal, anti-infective and anti-inflammatory drugs, initial encounter: Secondary | ICD-10-CM | POA: Diagnosis not present

## 2022-08-08 DIAGNOSIS — L233 Allergic contact dermatitis due to drugs in contact with skin: Secondary | ICD-10-CM | POA: Diagnosis not present

## 2022-08-08 MED ORDER — METHYLPREDNISOLONE 4 MG PO TBPK
ORAL_TABLET | ORAL | 0 refills | Status: DC
  Filled 2022-08-08: qty 21, 6d supply, fill #0

## 2022-08-21 ENCOUNTER — Other Ambulatory Visit (HOSPITAL_COMMUNITY): Payer: Self-pay

## 2022-08-22 ENCOUNTER — Other Ambulatory Visit (HOSPITAL_COMMUNITY): Payer: Self-pay

## 2022-08-22 MED ORDER — BETAMETHASONE DIPROPIONATE 0.05 % EX OINT
1.0000 "application " | TOPICAL_OINTMENT | Freq: Two times a day (BID) | CUTANEOUS | 0 refills | Status: DC
  Filled 2022-08-22: qty 45, 30d supply, fill #0

## 2022-10-23 ENCOUNTER — Other Ambulatory Visit (HOSPITAL_COMMUNITY): Payer: Self-pay

## 2022-10-24 ENCOUNTER — Other Ambulatory Visit (HOSPITAL_COMMUNITY): Payer: Self-pay

## 2022-10-24 MED ORDER — LEVOTHYROXINE SODIUM 112 MCG PO TABS
112.0000 ug | ORAL_TABLET | ORAL | 0 refills | Status: DC
  Filled 2022-10-24: qty 90, 90d supply, fill #0

## 2022-10-28 ENCOUNTER — Other Ambulatory Visit (HOSPITAL_COMMUNITY): Payer: Self-pay

## 2022-11-10 ENCOUNTER — Other Ambulatory Visit (HOSPITAL_COMMUNITY): Payer: Self-pay

## 2022-11-11 ENCOUNTER — Other Ambulatory Visit (HOSPITAL_COMMUNITY): Payer: Self-pay

## 2022-11-11 MED ORDER — BETAMETHASONE DIPROPIONATE 0.05 % EX OINT
1.0000 "application " | TOPICAL_OINTMENT | Freq: Two times a day (BID) | CUTANEOUS | 0 refills | Status: DC
  Filled 2022-11-11: qty 45, 30d supply, fill #0

## 2022-11-19 DIAGNOSIS — S20361A Insect bite (nonvenomous) of right front wall of thorax, initial encounter: Secondary | ICD-10-CM | POA: Diagnosis not present

## 2022-11-24 ENCOUNTER — Other Ambulatory Visit (HOSPITAL_COMMUNITY): Payer: Self-pay

## 2022-11-24 DIAGNOSIS — I890 Lymphedema, not elsewhere classified: Secondary | ICD-10-CM | POA: Diagnosis not present

## 2022-11-24 DIAGNOSIS — M5430 Sciatica, unspecified side: Secondary | ICD-10-CM | POA: Diagnosis not present

## 2022-11-24 MED ORDER — PREDNISONE 20 MG PO TABS
ORAL_TABLET | ORAL | 0 refills | Status: AC
  Filled 2022-11-24: qty 18, 9d supply, fill #0

## 2022-11-25 DIAGNOSIS — H524 Presbyopia: Secondary | ICD-10-CM | POA: Diagnosis not present

## 2022-12-02 ENCOUNTER — Ambulatory Visit
Admission: RE | Admit: 2022-12-02 | Discharge: 2022-12-02 | Disposition: A | Discharge status: Home / Self Care | Payer: Commercial Managed Care - PPO | Source: Ambulatory Visit | Attending: Family Medicine | Admitting: Family Medicine

## 2022-12-02 DIAGNOSIS — Z780 Asymptomatic menopausal state: Secondary | ICD-10-CM | POA: Diagnosis not present

## 2022-12-02 DIAGNOSIS — Z13820 Encounter for screening for osteoporosis: Secondary | ICD-10-CM

## 2022-12-04 DIAGNOSIS — M5430 Sciatica, unspecified side: Secondary | ICD-10-CM | POA: Diagnosis not present

## 2022-12-09 DIAGNOSIS — M5430 Sciatica, unspecified side: Secondary | ICD-10-CM | POA: Diagnosis not present

## 2022-12-11 DIAGNOSIS — M5430 Sciatica, unspecified side: Secondary | ICD-10-CM | POA: Diagnosis not present

## 2022-12-16 DIAGNOSIS — M5430 Sciatica, unspecified side: Secondary | ICD-10-CM | POA: Diagnosis not present

## 2022-12-18 DIAGNOSIS — M5430 Sciatica, unspecified side: Secondary | ICD-10-CM | POA: Diagnosis not present

## 2022-12-23 DIAGNOSIS — M5430 Sciatica, unspecified side: Secondary | ICD-10-CM | POA: Diagnosis not present

## 2022-12-25 DIAGNOSIS — M5430 Sciatica, unspecified side: Secondary | ICD-10-CM | POA: Diagnosis not present

## 2022-12-30 ENCOUNTER — Other Ambulatory Visit (HOSPITAL_COMMUNITY): Payer: Self-pay

## 2022-12-30 DIAGNOSIS — I2510 Atherosclerotic heart disease of native coronary artery without angina pectoris: Secondary | ICD-10-CM | POA: Diagnosis not present

## 2022-12-30 DIAGNOSIS — L309 Dermatitis, unspecified: Secondary | ICD-10-CM | POA: Diagnosis not present

## 2022-12-30 DIAGNOSIS — M5430 Sciatica, unspecified side: Secondary | ICD-10-CM | POA: Diagnosis not present

## 2022-12-30 DIAGNOSIS — E7800 Pure hypercholesterolemia, unspecified: Secondary | ICD-10-CM | POA: Diagnosis not present

## 2022-12-30 DIAGNOSIS — I10 Essential (primary) hypertension: Secondary | ICD-10-CM | POA: Diagnosis not present

## 2022-12-30 DIAGNOSIS — Z0000 Encounter for general adult medical examination without abnormal findings: Secondary | ICD-10-CM | POA: Diagnosis not present

## 2022-12-30 DIAGNOSIS — I890 Lymphedema, not elsewhere classified: Secondary | ICD-10-CM | POA: Diagnosis not present

## 2022-12-30 DIAGNOSIS — K219 Gastro-esophageal reflux disease without esophagitis: Secondary | ICD-10-CM | POA: Diagnosis not present

## 2022-12-30 DIAGNOSIS — E039 Hypothyroidism, unspecified: Secondary | ICD-10-CM | POA: Diagnosis not present

## 2022-12-30 MED ORDER — KETOCONAZOLE 2 % EX CREA
1.0000 | TOPICAL_CREAM | Freq: Every day | CUTANEOUS | 0 refills | Status: DC
  Filled 2022-12-30: qty 15, 15d supply, fill #0

## 2023-01-09 DIAGNOSIS — Z01419 Encounter for gynecological examination (general) (routine) without abnormal findings: Secondary | ICD-10-CM | POA: Diagnosis not present

## 2023-01-24 ENCOUNTER — Other Ambulatory Visit (HOSPITAL_COMMUNITY): Payer: Self-pay

## 2023-01-25 ENCOUNTER — Other Ambulatory Visit (HOSPITAL_COMMUNITY): Payer: Self-pay

## 2023-01-26 ENCOUNTER — Other Ambulatory Visit (HOSPITAL_COMMUNITY): Payer: Self-pay

## 2023-01-26 DIAGNOSIS — L02212 Cutaneous abscess of back [any part, except buttock]: Secondary | ICD-10-CM | POA: Diagnosis not present

## 2023-01-26 MED ORDER — DOXYCYCLINE HYCLATE 100 MG PO TABS
100.0000 mg | ORAL_TABLET | Freq: Two times a day (BID) | ORAL | 0 refills | Status: DC
  Filled 2023-01-26: qty 20, 10d supply, fill #0

## 2023-01-27 ENCOUNTER — Other Ambulatory Visit (HOSPITAL_COMMUNITY): Payer: Self-pay

## 2023-01-27 MED ORDER — LEVOTHYROXINE SODIUM 112 MCG PO TABS
112.0000 ug | ORAL_TABLET | Freq: Every morning | ORAL | 1 refills | Status: AC
  Filled 2023-01-27: qty 90, 90d supply, fill #0
  Filled 2023-04-27: qty 90, 90d supply, fill #1

## 2023-01-29 ENCOUNTER — Other Ambulatory Visit (HOSPITAL_COMMUNITY): Payer: Self-pay

## 2023-01-29 DIAGNOSIS — B356 Tinea cruris: Secondary | ICD-10-CM | POA: Diagnosis not present

## 2023-01-29 DIAGNOSIS — L02212 Cutaneous abscess of back [any part, except buttock]: Secondary | ICD-10-CM | POA: Diagnosis not present

## 2023-01-29 MED ORDER — TERBINAFINE HCL 1 % EX CREA: 1.0000 | TOPICAL_CREAM | Freq: Two times a day (BID) | CUTANEOUS | 2 refills | Status: DC

## 2023-01-30 ENCOUNTER — Other Ambulatory Visit (HOSPITAL_COMMUNITY): Payer: Self-pay

## 2023-01-31 DIAGNOSIS — L02212 Cutaneous abscess of back [any part, except buttock]: Secondary | ICD-10-CM | POA: Diagnosis not present

## 2023-01-31 DIAGNOSIS — L509 Urticaria, unspecified: Secondary | ICD-10-CM | POA: Diagnosis not present

## 2023-02-05 ENCOUNTER — Other Ambulatory Visit (HOSPITAL_COMMUNITY): Payer: Self-pay

## 2023-02-06 ENCOUNTER — Other Ambulatory Visit (HOSPITAL_COMMUNITY): Payer: Self-pay

## 2023-02-06 MED ORDER — BETAMETHASONE DIPROPIONATE 0.05 % EX OINT
1.0000 | TOPICAL_OINTMENT | Freq: Two times a day (BID) | CUTANEOUS | 0 refills | Status: DC
  Filled 2023-02-06: qty 45, 10d supply, fill #0

## 2023-02-09 ENCOUNTER — Other Ambulatory Visit (HOSPITAL_COMMUNITY): Payer: Self-pay

## 2023-02-11 ENCOUNTER — Other Ambulatory Visit (HOSPITAL_COMMUNITY): Payer: Self-pay

## 2023-02-11 DIAGNOSIS — L304 Erythema intertrigo: Secondary | ICD-10-CM | POA: Diagnosis not present

## 2023-02-11 MED ORDER — KETOCONAZOLE 2 % EX CREA
TOPICAL_CREAM | Freq: Two times a day (BID) | CUTANEOUS | 0 refills | Status: DC | PRN
  Filled 2023-02-11: qty 60, 7d supply, fill #0

## 2023-02-16 ENCOUNTER — Other Ambulatory Visit (HOSPITAL_COMMUNITY): Payer: Self-pay

## 2023-02-16 MED ORDER — HYDROCHLOROTHIAZIDE 25 MG PO TABS
25.0000 mg | ORAL_TABLET | Freq: Every day | ORAL | 3 refills | Status: AC
  Filled 2023-02-16: qty 90, 90d supply, fill #0
  Filled 2023-05-29: qty 90, 90d supply, fill #1
  Filled 2023-08-27: qty 90, 90d supply, fill #2

## 2023-04-22 ENCOUNTER — Telehealth: Payer: Self-pay | Admitting: Gastroenterology

## 2023-04-22 NOTE — Telephone Encounter (Signed)
No longer required.

## 2023-04-27 ENCOUNTER — Other Ambulatory Visit (HOSPITAL_COMMUNITY): Payer: Self-pay

## 2023-04-27 MED ORDER — ATORVASTATIN CALCIUM 10 MG PO TABS
10.0000 mg | ORAL_TABLET | Freq: Every day | ORAL | 3 refills | Status: DC
  Filled 2023-04-27: qty 90, 90d supply, fill #0
  Filled 2023-07-24: qty 90, 90d supply, fill #1
  Filled 2023-10-21: qty 90, 90d supply, fill #2
  Filled 2024-01-18: qty 90, 90d supply, fill #3

## 2023-04-27 MED ORDER — BETAMETHASONE DIPROPIONATE 0.05 % EX OINT
1.0000 | TOPICAL_OINTMENT | Freq: Two times a day (BID) | CUTANEOUS | 0 refills | Status: DC
  Filled 2023-04-27: qty 45, 30d supply, fill #0

## 2023-05-19 ENCOUNTER — Ambulatory Visit (AMBULATORY_SURGERY_CENTER): Payer: Commercial Managed Care - PPO | Admitting: *Deleted

## 2023-05-19 ENCOUNTER — Other Ambulatory Visit (HOSPITAL_COMMUNITY): Payer: Self-pay

## 2023-05-19 VITALS — Ht 61.0 in | Wt 215.0 lb

## 2023-05-19 DIAGNOSIS — E0500 Thyrotoxicosis with diffuse goiter without thyrotoxic crisis or storm: Secondary | ICD-10-CM

## 2023-05-19 DIAGNOSIS — Z1211 Encounter for screening for malignant neoplasm of colon: Secondary | ICD-10-CM

## 2023-05-19 DIAGNOSIS — E785 Hyperlipidemia, unspecified: Secondary | ICD-10-CM

## 2023-05-19 DIAGNOSIS — I10 Essential (primary) hypertension: Secondary | ICD-10-CM

## 2023-05-19 DIAGNOSIS — E039 Hypothyroidism, unspecified: Secondary | ICD-10-CM

## 2023-05-19 DIAGNOSIS — I890 Lymphedema, not elsewhere classified: Secondary | ICD-10-CM | POA: Insufficient documentation

## 2023-05-19 MED ORDER — NA SULFATE-K SULFATE-MG SULF 17.5-3.13-1.6 GM/177ML PO SOLN
1.0000 | Freq: Once | ORAL | 0 refills | Status: AC
  Filled 2023-05-19: qty 354, 1d supply, fill #0

## 2023-05-19 NOTE — Progress Notes (Signed)
  Pre visit completed over telephone.  Questions answered to patient's satisfactions. Instructions mailed to home address.   No egg or soy allergy known to patient  No issues known to pt with past sedation with any surgeries or procedures Patient denies ever being told they had issues or difficulty with intubation  No FH of Malignant Hyperthermia Pt is not on diet pills Pt is not on  home 02  Pt is not on blood thinners  Pt denies issues with constipation  No A fib or A flutter Have any cardiac testing pending--NO Pt instructed to use [email or web address] or GoodRx for a price reduction on prep

## 2023-05-20 ENCOUNTER — Encounter: Payer: Self-pay | Admitting: Internal Medicine

## 2023-05-29 ENCOUNTER — Other Ambulatory Visit: Payer: Self-pay | Admitting: Internal Medicine

## 2023-05-29 DIAGNOSIS — Z1231 Encounter for screening mammogram for malignant neoplasm of breast: Secondary | ICD-10-CM

## 2023-06-02 ENCOUNTER — Encounter: Payer: Self-pay | Admitting: Internal Medicine

## 2023-06-02 ENCOUNTER — Ambulatory Visit: Payer: Commercial Managed Care - PPO | Admitting: Internal Medicine

## 2023-06-02 VITALS — BP 149/89 | HR 69 | Temp 97.6°F | Resp 15 | Ht 61.5 in | Wt 215.0 lb

## 2023-06-02 DIAGNOSIS — D125 Benign neoplasm of sigmoid colon: Secondary | ICD-10-CM | POA: Diagnosis not present

## 2023-06-02 DIAGNOSIS — I10 Essential (primary) hypertension: Secondary | ICD-10-CM | POA: Diagnosis not present

## 2023-06-02 DIAGNOSIS — D123 Benign neoplasm of transverse colon: Secondary | ICD-10-CM | POA: Diagnosis not present

## 2023-06-02 DIAGNOSIS — D122 Benign neoplasm of ascending colon: Secondary | ICD-10-CM | POA: Diagnosis not present

## 2023-06-02 DIAGNOSIS — Z1211 Encounter for screening for malignant neoplasm of colon: Secondary | ICD-10-CM | POA: Diagnosis not present

## 2023-06-02 DIAGNOSIS — E039 Hypothyroidism, unspecified: Secondary | ICD-10-CM | POA: Diagnosis not present

## 2023-06-02 MED ORDER — SODIUM CHLORIDE 0.9 % IV SOLN: 500.0000 mL | Freq: Once | INTRAVENOUS | Status: DC

## 2023-06-02 NOTE — Patient Instructions (Addendum)
Continue present medications. Await pathology results. Repeat colonoscopy is recommended. The colonoscopy date will be determined after pathology results from today's exam become available for review.                                                                            YOU HAD AN ENDOSCOPIC PROCEDURE TODAY AT THE [facility]:   Refer to the procedure report that was given to you for any specific questions about what was found during the examination.  If the procedure report does not answer your questions, please call your gastroenterologist to clarify.  If you requested that your care partner not be given the details of your procedure findings, then the procedure report has been included in a sealed envelope for you to review at your convenience later.  YOU SHOULD EXPECT: Some feelings of bloating in the abdomen. Passage of more gas than usual.  Walking can help get rid of the air that was put into your GI tract during the procedure and reduce the bloating. If you had a lower endoscopy (such as a colonoscopy or flexible sigmoidoscopy) you may notice spotting of blood in your stool or on the toilet paper. If you underwent a bowel prep for your procedure, you may not have a normal bowel movement for a few days.  Please Note:  You might notice some irritation and congestion in your nose or some drainage.  This is from the oxygen used during your procedure.  There is no need for concern and it should clear up in a day or so.  SYMPTOMS TO REPORT IMMEDIATELY:  Following lower endoscopy (colonoscopy or flexible sigmoidoscopy):  Excessive amounts of blood in the stool  Significant tenderness or worsening of abdominal pains  Swelling of the abdomen that is new, acute  Fever of 100F or higher  For urgent or emergent issues, a gastroenterologist can be reached at any hour by calling [phone]. Do not use MyChart messaging for urgent concerns.    DIET:  We do recommend a small  meal at first, but then you may proceed to your regular diet.  Drink plenty of fluids but you should avoid alcoholic beverages for 24 hours.  ACTIVITY:  You should plan to take it easy for the rest of today and you should NOT DRIVE or use heavy machinery until tomorrow (because of the sedation medicines used during the test).    FOLLOW UP: Our staff will call the number listed on your records the next business day following your procedure.  We will call around 7:15- 8:00 am to check on you and address any questions or concerns that you may have regarding the information given to you following your procedure. If we do not reach you, we will leave a message.     If any biopsies were taken you will be contacted by phone or by letter within the next 1-3 weeks.  Please call us at [phone] if you have not heard about the biopsies in 3 weeks.    SIGNATURES/CONFIDENTIALITY: You and/or your care partner have signed paperwork which will be entered into your electronic medical record.  These signatures attest to the fact that that the information above on  your After Visit Summary has been reviewed and is understood.  Full responsibility of the confidentiality of this discharge information lies with you and/or your care-partner.

## 2023-06-02 NOTE — Progress Notes (Signed)
Sedate, gd SR, tolerated procedure well, VSS, report to RN 

## 2023-06-02 NOTE — Progress Notes (Signed)
Pt's states no medical or surgical changes since previsit or office visit. 

## 2023-06-02 NOTE — Progress Notes (Signed)
GASTROENTEROLOGY PROCEDURE H&P NOTE   Primary Care Physician: Maurice Small, MD (Inactive)    Reason for Procedure:  Colon cancer screening  Plan:    Colonoscopy  Patient is appropriate for endoscopic procedure(s) in the ambulatory ([facility]) setting.  The nature of the procedure, as well as the risks, benefits, and alternatives were carefully and thoroughly reviewed with the patient. Ample time for discussion and questions allowed. The patient understood, was satisfied, and agreed to proceed.     HPI: [name] is a 67 y.o. female who presents for colonoscopy.  Medical history as below.  Tolerated the prep.  No recent chest pain or shortness of breath.  No abdominal pain today.  Past Medical History:  Diagnosis Date   Allergy    GERD (gastroesophageal reflux disease)    Graves disease    High blood pressure    Hypercholesterolemia    Hypothyroid    Lymphedema     Past Surgical History:  Procedure Laterality Date   ABLATION  2007   for Graves   CESAREAN SECTION     x3   TUBAL LIGATION Bilateral    UTERINE FIBROID EMBOLIZATION  2010    Prior to Admission medications   Medication Sig Start Date End Date Taking? Authorizing Provider  betamethasone dipropionate (DIPROLENE) 0.05 % ointment Apply 1 application  topically to affected area on the skin to arms, back and hands 2 (two) times daily for 7-10 days, then as needed 04/27/23  Yes   Calcium 600-200 MG-UNIT tablet Take 2 tablets by mouth daily.    Yes [provider]  ferrous sulfate 325 (65 FE) MG EC tablet Take 325 mg by mouth 3 (three) times daily with meals.   Yes [provider]  hydrochlorothiazide (HYDRODIURIL) 25 MG tablet Take 1 tablet (25 mg total) by mouth daily. 02/16/23  Yes   levothyroxine (SYNTHROID) 112 MCG tablet Take 1 tablet (112 mcg total) by mouth in the morning on an empy stomach 01/27/23  Yes   atorvastatin (LIPITOR) 10 MG tablet Take 1 tablet (10 mg total) by mouth daily.  04/27/23     cetirizine (ZYRTEC) 10 MG tablet 1 tablet Orally Once a day for 14 days 01/31/23   [provider]  diphenhydrAMINE (BENADRYL) 25 mg capsule Take 25 mg by mouth every 6 (six) hours as needed.    [provider]  fluticasone (CUTIVATE) 0.005 % ointment APPLY TO RASH ON THE SKIN TWO TIMES DAILY FOR 5 TO 7 DAYS Patient not taking: Reported on 05/19/2023 05/28/21     hydrochlorothiazide (HYDRODIURIL) 12.5 MG tablet TAKE 1 TABLET BY MOUTH IN THE MORNING ONCE DAILY. 12/03/20 12/03/21  Maurice Small, MD  HYDROcodone-acetaminophen (NORCO/VICODIN) 5-325 MG tablet Take 1-2 tablets by mouth every 4 (four) hours as needed. (Max of 8 tablets per day). 11/12/21     losartan (COZAAR) 100 MG tablet TAKE 1 TABLET BY MOUTH ONCE DAILY 09/03/20 02/05/22  Maurice Small, MD  losartan-hydrochlorothiazide [facility]) 100-12.5 MG tablet Take 1 tablet by mouth daily.  03/04/21  [provider]    Current Outpatient Medications  Medication Sig Dispense Refill   betamethasone dipropionate (DIPROLENE) 0.05 % ointment Apply 1 application  topically to affected area on the skin to arms, back and hands 2 (two) times daily for 7-10 days, then as needed 45 g 0   Calcium 600-200 MG-UNIT tablet Take 2 tablets by mouth daily.      ferrous sulfate 325 (65 FE) MG EC tablet Take 325  mg by mouth 3 (three) times daily with meals.     hydrochlorothiazide (HYDRODIURIL) 25 MG tablet Take 1 tablet (25 mg total) by mouth daily. 90 tablet 3   levothyroxine (SYNTHROID) 112 MCG tablet Take 1 tablet (112 mcg total) by mouth in the morning on an empy stomach 90 tablet 1   atorvastatin (LIPITOR) 10 MG tablet Take 1 tablet (10 mg total) by mouth daily. 90 tablet 3   cetirizine (ZYRTEC) 10 MG tablet 1 tablet Orally Once a day for 14 days     diphenhydrAMINE (BENADRYL) 25 mg capsule Take 25 mg by mouth every 6 (six) hours as needed.     fluticasone (CUTIVATE) 0.005 % ointment APPLY TO RASH ON THE SKIN TWO TIMES DAILY  FOR 5 TO 7 DAYS (Patient not taking: Reported on 05/19/2023) 30 g 0   hydrochlorothiazide (HYDRODIURIL) 12.5 MG tablet TAKE 1 TABLET BY MOUTH IN THE MORNING ONCE DAILY. 90 tablet 1   HYDROcodone-acetaminophen (NORCO/VICODIN) 5-325 MG tablet Take 1-2 tablets by mouth every 4 (four) hours as needed. (Max of 8 tablets per day). 12 tablet 0   losartan (COZAAR) 100 MG tablet TAKE 1 TABLET BY MOUTH ONCE DAILY 90 tablet 1   Current Facility-Administered Medications  Medication Dose Route Frequency Provider Last Rate Last Admin   0.9 %  sodium chloride infusion  500 mL Intravenous Once Lauren Modisette, Carie Caddy, MD        Allergies as of 06/02/2023 - Review Complete 06/02/2023  Allergen Reaction Noted   Codeine Nausea Only 12/21/2017   Latex Hives and Itching 12/21/2017   Losartan potassium Itching 05/19/2023   Benazepril Itching and Rash 10/09/2020   Hydrocodone-ibuprofen Itching and Rash 10/09/2020    Family History  Problem Relation Age of Onset   Colon polyps Mother    Hypertension Mother    Thyroid disease Mother    Obesity Mother    Luiz Blare' disease Mother    Hypertension Father    Hypertension Sister    Breast cancer Sister    Breast cancer Sister    Tuberculosis Maternal Grandmother    Hypertension Maternal Grandfather     Social History   Socioeconomic History   Marital status: Married    Spouse name: Halia Ray   Number of children: 3   Years of education: Not on file   Highest education level: Not on file  Occupational History   Not on file  Tobacco Use   Smoking status: Never   Smokeless tobacco: Never  Vaping Use   Vaping status: Never Used  Substance and Sexual Activity   Alcohol use: Not Currently   Drug use: Not Currently   Sexual activity: Yes    Partners: Male  Other Topics Concern   Not on file  Social History Narrative   Lives with husband   Occas caffeine   Social Determinants of Health   Financial Resource Strain: Not on file  Food Insecurity: Not on  file  Transportation Needs: Not on file  Physical Activity: Not on file  Stress: Not on file  Social Connections: Unknown (01/20/2022)   Received from [facility]   Social Network    Social Network: Not on file  Intimate Partner Violence: Unknown (12/12/2021)   Received from [facility], [facility]   HITS    Physically Hurt: Not on file    Insult or Talk Down To: Not on file    Threaten Physical Harm: Not on file    Scream or Curse:  Not on file    Physical Exam: Vital signs in last 24 hours: @BP  (!) 180/112   Pulse 76   Temp 97.6 F (36.4 C)   Resp 14   Ht 5' 1.5" (1.562 m)   Wt 215 lb (97.5 kg)   LMP 12/29/2011   SpO2 (!) 67%   BMI 39.97 kg/m  GEN: NAD EYE: Sclerae anicteric ENT: MMM CV: Non-tachycardic Pulm: CTA b/l GI: Soft, NT/ND NEURO:  Alert & Oriented x 3   Erick Blinks, MD [facility]  06/02/2023 8:00 AM

## 2023-06-02 NOTE — Op Note (Signed)
[facility] Patient Name: [name] Procedure Date: 06/02/2023 7:52 AM MRN: [id number] Endoscopist: Beverley Fiedler , MD, [id number] Age: 67 Referring MD:  Date of Birth: June 20, 1956 Gender: Female Account #: [id number] Procedure:                Colonoscopy Indications:              Screening for colorectal malignant neoplasm, Last                            colonoscopy 10 years ago Medicines:                Monitored Anesthesia Care Procedure:                Pre-Anesthesia Assessment:                           - Prior to the procedure, a History and Physical                            was performed, and patient medications and                            allergies were reviewed. The patient's tolerance of                            previous anesthesia was also reviewed. The risks                            and benefits of the procedure and the sedation                            options and risks were discussed with the patient.                            All questions were answered, and informed consent                            was obtained. Prior Anticoagulants: The patient has                            taken no anticoagulant or antiplatelet agents. ASA                            Grade Assessment: II - A patient with mild systemic                            disease. After reviewing the risks and benefits,                            the patient was deemed in satisfactory condition to                            undergo the procedure.  After obtaining informed consent, the colonoscope                            was passed under direct vision. Throughout the                            procedure, the patient's blood pressure, pulse, and                            oxygen saturations were monitored continuously. The                            PCF-HQ190L Colonoscope [id number] was introduced                            through the anus and advanced to the  cecum,                            identified by appendiceal orifice and ileocecal                            valve. The colonoscopy was performed without                            difficulty. The patient tolerated the procedure                            well. The quality of the bowel preparation was                            good. The ileocecal valve, appendiceal orifice, and                            rectum were photographed. Scope In: 8:02:45 AM Scope Out: 8:17:06 AM Scope Withdrawal Time: 0 hours 11 minutes 3 seconds  Total Procedure Duration: 0 hours 14 minutes 21 seconds  Findings:                 The digital rectal exam was normal.                           A 3 mm polyp was found in the ascending colon. The                            polyp was sessile. The polyp was removed with a                            cold biopsy forceps. Resection and retrieval were                            complete.                           A 2 mm polyp was found in the transverse colon. The  polyp was sessile. The polyp was removed with a                            cold biopsy forceps. Resection and retrieval were                            complete.                           A 5 mm polyp was found in the distal sigmoid colon.                            The polyp was sessile. The polyp was removed with a                            cold snare. Resection and retrieval were complete.                           Many large-mouthed, medium-mouthed and                            small-mouthed diverticula were found from ascending                            colon to sigmoid colon.                           The retroflexed view of the distal rectum and anal                            verge was normal and showed no anal or rectal                            abnormalities. Complications:            No immediate complications. Estimated Blood Loss:     Estimated blood loss:  none. Impression:               - One 3 mm polyp in the ascending colon, removed                            with a cold biopsy forceps. Resected and retrieved.                           - One 2 mm polyp in the transverse colon, removed                            with a cold biopsy forceps. Resected and retrieved.                           - One 5 mm polyp in the distal sigmoid colon,                            removed with a cold snare. Resected and  retrieved.                           - Severe diverticulosis from ascending colon to                            sigmoid colon.                           - The distal rectum and anal verge are normal on                            retroflexion view. Recommendation:           - Patient has a contact number available for                            emergencies. The signs and symptoms of potential                            delayed complications were discussed with the                            patient. Return to normal activities tomorrow.                            Written discharge instructions were provided to the                            patient.                           - Resume previous diet.                           - Continue present medications.                           - Await pathology results.                           - Repeat colonoscopy is recommended. The                            colonoscopy date will be determined after pathology                            results from today's exam become available for                            review. Beverley Fiedler, MD 06/02/2023 8:20:59 AM This report has been signed electronically.

## 2023-06-02 NOTE — Progress Notes (Signed)
Called to room to assist during endoscopic procedure.  Patient ID and intended procedure confirmed with present staff. Received instructions for my participation in the procedure from the performing physician.  

## 2023-06-03 ENCOUNTER — Telehealth: Payer: Self-pay | Admitting: *Deleted

## 2023-06-03 NOTE — Telephone Encounter (Signed)
  Follow up Call-     06/02/2023    7:22 AM  Call back number  Post procedure Call Back phone  # [phone]  Permission to leave phone message Yes     Patient questions:  Do you have a fever, pain , or abdominal swelling? No. Pain Score  0 *  Have you tolerated food without any problems? Yes.    Have you been able to return to your normal activities? Yes.    Do you have any questions about your discharge instructions: Diet   No. Medications  No. Follow up visit  No.  Do you have questions or concerns about your Care? No.  Actions: * If pain score is 4 or above: No action needed, pain <4.

## 2023-06-04 LAB — SURGICAL PATHOLOGY

## 2023-06-05 ENCOUNTER — Encounter: Payer: Self-pay | Admitting: Internal Medicine

## 2023-06-15 ENCOUNTER — Other Ambulatory Visit (HOSPITAL_COMMUNITY): Payer: Self-pay

## 2023-06-15 DIAGNOSIS — L309 Dermatitis, unspecified: Secondary | ICD-10-CM | POA: Diagnosis not present

## 2023-06-15 MED ORDER — BETAMETHASONE DIPROPIONATE 0.05 % EX OINT
1.0000 | TOPICAL_OINTMENT | Freq: Every day | CUTANEOUS | 0 refills | Status: DC
  Filled 2023-06-15: qty 45, 30d supply, fill #0

## 2023-06-22 ENCOUNTER — Other Ambulatory Visit (HOSPITAL_COMMUNITY): Payer: Self-pay

## 2023-06-22 DIAGNOSIS — L309 Dermatitis, unspecified: Secondary | ICD-10-CM | POA: Diagnosis not present

## 2023-06-22 MED ORDER — PREDNISONE 10 MG PO TABS
ORAL_TABLET | ORAL | 0 refills | Status: AC
  Filled 2023-06-22: qty 20, 8d supply, fill #0

## 2023-06-24 ENCOUNTER — Ambulatory Visit
Admission: RE | Admit: 2023-06-24 | Discharge: 2023-06-24 | Disposition: A | Discharge status: Home / Self Care | Payer: Commercial Managed Care - PPO | Source: Ambulatory Visit | Attending: Internal Medicine

## 2023-06-24 DIAGNOSIS — Z1231 Encounter for screening mammogram for malignant neoplasm of breast: Secondary | ICD-10-CM | POA: Diagnosis not present

## 2023-07-09 ENCOUNTER — Other Ambulatory Visit (HOSPITAL_COMMUNITY): Payer: Self-pay

## 2023-07-09 DIAGNOSIS — D649 Anemia, unspecified: Secondary | ICD-10-CM | POA: Diagnosis not present

## 2023-07-09 DIAGNOSIS — B356 Tinea cruris: Secondary | ICD-10-CM | POA: Diagnosis not present

## 2023-07-09 DIAGNOSIS — Z6839 Body mass index (BMI) 39.0-39.9, adult: Secondary | ICD-10-CM | POA: Diagnosis not present

## 2023-07-09 DIAGNOSIS — E039 Hypothyroidism, unspecified: Secondary | ICD-10-CM | POA: Diagnosis not present

## 2023-07-09 DIAGNOSIS — I10 Essential (primary) hypertension: Secondary | ICD-10-CM | POA: Diagnosis not present

## 2023-07-09 DIAGNOSIS — E7800 Pure hypercholesterolemia, unspecified: Secondary | ICD-10-CM | POA: Diagnosis not present

## 2023-07-09 DIAGNOSIS — R21 Rash and other nonspecific skin eruption: Secondary | ICD-10-CM | POA: Diagnosis not present

## 2023-07-09 DIAGNOSIS — Z23 Encounter for immunization: Secondary | ICD-10-CM | POA: Diagnosis not present

## 2023-07-09 MED ORDER — TERBINAFINE HCL 1 % EX CREA
1.0000 | TOPICAL_CREAM | Freq: Two times a day (BID) | CUTANEOUS | 2 refills | Status: AC | PRN
Start: 2023-07-09 — End: 2023-07-23

## 2023-07-13 ENCOUNTER — Other Ambulatory Visit (HOSPITAL_COMMUNITY): Payer: Self-pay

## 2023-07-13 MED ORDER — LEVOTHYROXINE SODIUM 112 MCG PO TABS
112.0000 ug | ORAL_TABLET | Freq: Every morning | ORAL | 2 refills | Status: DC
Start: 2023-07-13 — End: 2024-04-18
  Filled 2023-07-13: qty 90, 90d supply, fill #0
  Filled 2023-10-21: qty 90, 90d supply, fill #1
  Filled 2024-01-18: qty 90, 90d supply, fill #2

## 2023-07-14 ENCOUNTER — Encounter (HOSPITAL_COMMUNITY): Payer: Self-pay

## 2023-07-14 ENCOUNTER — Other Ambulatory Visit (HOSPITAL_COMMUNITY): Payer: Self-pay

## 2023-07-24 ENCOUNTER — Other Ambulatory Visit (HOSPITAL_COMMUNITY): Payer: Self-pay

## 2023-08-27 ENCOUNTER — Other Ambulatory Visit (HOSPITAL_COMMUNITY): Payer: Self-pay

## 2023-08-27 MED ORDER — BETAMETHASONE DIPROPIONATE 0.05 % EX OINT
1.0000 | TOPICAL_OINTMENT | Freq: Every day | CUTANEOUS | 0 refills | Status: DC
  Filled 2023-08-27: qty 45, 30d supply, fill #0

## 2023-08-31 ENCOUNTER — Other Ambulatory Visit (HOSPITAL_COMMUNITY): Payer: Self-pay

## 2023-09-01 ENCOUNTER — Other Ambulatory Visit (HOSPITAL_COMMUNITY): Payer: Self-pay

## 2023-10-21 ENCOUNTER — Other Ambulatory Visit (HOSPITAL_COMMUNITY): Payer: Self-pay

## 2023-11-19 ENCOUNTER — Encounter (HOSPITAL_COMMUNITY): Payer: Self-pay

## 2023-11-19 ENCOUNTER — Other Ambulatory Visit (HOSPITAL_COMMUNITY): Payer: Self-pay

## 2023-11-19 DIAGNOSIS — L239 Allergic contact dermatitis, unspecified cause: Secondary | ICD-10-CM | POA: Diagnosis not present

## 2023-11-19 MED ORDER — BETAMETHASONE DIPROPIONATE 0.05 % EX OINT
1.0000 | TOPICAL_OINTMENT | Freq: Every day | CUTANEOUS | 0 refills | Status: DC
  Filled 2023-11-19: qty 45, 30d supply, fill #0

## 2023-11-23 ENCOUNTER — Other Ambulatory Visit (HOSPITAL_COMMUNITY): Payer: Self-pay

## 2023-11-23 MED ORDER — PREDNISONE 10 MG PO TABS
ORAL_TABLET | ORAL | 0 refills | Status: AC
  Filled 2023-11-23: qty 20, 8d supply, fill #0

## 2023-11-26 ENCOUNTER — Other Ambulatory Visit (HOSPITAL_COMMUNITY): Payer: Self-pay

## 2023-11-26 DIAGNOSIS — I10 Essential (primary) hypertension: Secondary | ICD-10-CM | POA: Diagnosis not present

## 2023-11-26 DIAGNOSIS — L239 Allergic contact dermatitis, unspecified cause: Secondary | ICD-10-CM | POA: Diagnosis not present

## 2023-11-26 MED ORDER — HYDROXYZINE HCL 25 MG PO TABS
25.0000 mg | ORAL_TABLET | Freq: Every evening | ORAL | 0 refills | Status: AC | PRN
  Filled 2023-11-26: qty 30, 30d supply, fill #0

## 2023-12-29 ENCOUNTER — Other Ambulatory Visit (HOSPITAL_COMMUNITY): Payer: Self-pay

## 2023-12-29 MED ORDER — BETAMETHASONE DIPROPIONATE 0.05 % EX OINT
1.0000 | TOPICAL_OINTMENT | Freq: Every day | CUTANEOUS | 0 refills | Status: DC
Start: 2023-12-29 — End: 2024-04-05
  Filled 2023-12-29: qty 45, 30d supply, fill #0

## 2024-01-06 ENCOUNTER — Other Ambulatory Visit (HOSPITAL_COMMUNITY): Payer: Self-pay

## 2024-01-06 MED ORDER — CHLORHEXIDINE GLUCONATE 0.12 % MT SOLN
15.0000 mL | OROMUCOSAL | 3 refills | Status: AC
  Filled 2024-01-06: qty 473, 16d supply, fill #0

## 2024-01-06 MED ORDER — DOXYCYCLINE HYCLATE 20 MG PO TABS
20.0000 mg | ORAL_TABLET | Freq: Two times a day (BID) | ORAL | 5 refills | Status: AC
  Filled 2024-01-06: qty 60, 30d supply, fill #0

## 2024-01-18 ENCOUNTER — Other Ambulatory Visit (HOSPITAL_COMMUNITY): Payer: Self-pay

## 2024-01-21 DIAGNOSIS — L309 Dermatitis, unspecified: Secondary | ICD-10-CM | POA: Diagnosis not present

## 2024-01-21 DIAGNOSIS — K219 Gastro-esophageal reflux disease without esophagitis: Secondary | ICD-10-CM | POA: Diagnosis not present

## 2024-01-21 DIAGNOSIS — E039 Hypothyroidism, unspecified: Secondary | ICD-10-CM | POA: Diagnosis not present

## 2024-01-21 DIAGNOSIS — Z0000 Encounter for general adult medical examination without abnormal findings: Secondary | ICD-10-CM | POA: Diagnosis not present

## 2024-01-21 DIAGNOSIS — D649 Anemia, unspecified: Secondary | ICD-10-CM | POA: Diagnosis not present

## 2024-01-21 DIAGNOSIS — I890 Lymphedema, not elsewhere classified: Secondary | ICD-10-CM | POA: Diagnosis not present

## 2024-01-21 DIAGNOSIS — E7800 Pure hypercholesterolemia, unspecified: Secondary | ICD-10-CM | POA: Diagnosis not present

## 2024-01-21 DIAGNOSIS — I10 Essential (primary) hypertension: Secondary | ICD-10-CM | POA: Diagnosis not present

## 2024-01-21 DIAGNOSIS — I2510 Atherosclerotic heart disease of native coronary artery without angina pectoris: Secondary | ICD-10-CM | POA: Diagnosis not present

## 2024-02-19 DIAGNOSIS — I890 Lymphedema, not elsewhere classified: Secondary | ICD-10-CM | POA: Diagnosis not present

## 2024-02-19 DIAGNOSIS — M79674 Pain in right toe(s): Secondary | ICD-10-CM | POA: Diagnosis not present

## 2024-02-22 ENCOUNTER — Inpatient Hospital Stay (HOSPITAL_COMMUNITY): Admit: 2024-02-22 | Payer: Self-pay

## 2024-02-22 ENCOUNTER — Other Ambulatory Visit: Payer: Self-pay | Admitting: Obstetrics and Gynecology

## 2024-02-22 ENCOUNTER — Other Ambulatory Visit (HOSPITAL_COMMUNITY)
Admission: RE | Admit: 2024-02-22 | Discharge: 2024-02-22 | Disposition: A | Discharge status: Home / Self Care | Source: Ambulatory Visit | Attending: Obstetrics and Gynecology | Admitting: Obstetrics and Gynecology

## 2024-02-22 DIAGNOSIS — Z124 Encounter for screening for malignant neoplasm of cervix: Secondary | ICD-10-CM | POA: Insufficient documentation

## 2024-02-22 DIAGNOSIS — Z01419 Encounter for gynecological examination (general) (routine) without abnormal findings: Secondary | ICD-10-CM | POA: Diagnosis not present

## 2024-02-25 LAB — CYTOLOGY - PAP
Adequacy: ABSENT
Comment: NEGATIVE
Diagnosis: NEGATIVE
High risk HPV: NEGATIVE

## 2024-04-04 ENCOUNTER — Other Ambulatory Visit (HOSPITAL_COMMUNITY): Payer: Self-pay

## 2024-04-05 ENCOUNTER — Other Ambulatory Visit (HOSPITAL_COMMUNITY): Payer: Self-pay

## 2024-04-05 MED ORDER — BETAMETHASONE DIPROPIONATE 0.05 % EX OINT
1.0000 | TOPICAL_OINTMENT | Freq: Every day | CUTANEOUS | 0 refills | Status: DC
  Filled 2024-04-05 – 2024-04-07 (×2): qty 45, 30d supply, fill #0

## 2024-04-07 ENCOUNTER — Other Ambulatory Visit (HOSPITAL_COMMUNITY): Payer: Self-pay

## 2024-04-08 ENCOUNTER — Other Ambulatory Visit (HOSPITAL_COMMUNITY): Payer: Self-pay

## 2024-04-08 MED ORDER — CHLORHEXIDINE GLUCONATE 0.12 % MT SOLN
OROMUCOSAL | 3 refills | Status: AC
  Filled 2024-04-08: qty 473, 30d supply, fill #0
  Filled 2024-04-08: qty 473, 18d supply, fill #0
  Filled 2024-05-24: qty 473, 30d supply, fill #1
  Filled 2024-07-22: qty 473, 30d supply, fill #2
  Filled 2024-10-13: qty 473, 30d supply, fill #3

## 2024-04-18 ENCOUNTER — Other Ambulatory Visit (HOSPITAL_COMMUNITY): Payer: Self-pay

## 2024-04-18 MED ORDER — ATORVASTATIN CALCIUM 10 MG PO TABS
10.0000 mg | ORAL_TABLET | Freq: Every day | ORAL | 3 refills | Status: AC
  Filled 2024-04-18: qty 90, 90d supply, fill #0
  Filled 2024-07-22: qty 90, 90d supply, fill #1

## 2024-04-18 MED ORDER — LEVOTHYROXINE SODIUM 112 MCG PO TABS
112.0000 ug | ORAL_TABLET | Freq: Every morning | ORAL | 3 refills | Status: AC
  Filled 2024-04-18: qty 90, 90d supply, fill #0
  Filled 2024-07-10: qty 90, 90d supply, fill #1
  Filled 2024-10-13: qty 90, 90d supply, fill #2

## 2024-04-20 ENCOUNTER — Other Ambulatory Visit (HOSPITAL_COMMUNITY): Payer: Self-pay

## 2024-05-17 ENCOUNTER — Other Ambulatory Visit (HOSPITAL_COMMUNITY): Payer: Self-pay

## 2024-05-17 DIAGNOSIS — L309 Dermatitis, unspecified: Secondary | ICD-10-CM | POA: Diagnosis not present

## 2024-05-17 MED ORDER — TACROLIMUS 0.1 % EX OINT
1.0000 | TOPICAL_OINTMENT | Freq: Two times a day (BID) | CUTANEOUS | 0 refills | Status: AC
  Filled 2024-05-17: qty 60, 30d supply, fill #0

## 2024-06-15 ENCOUNTER — Other Ambulatory Visit (HOSPITAL_COMMUNITY): Payer: Self-pay

## 2024-06-15 MED ORDER — BETAMETHASONE DIPROPIONATE 0.05 % EX OINT
1.0000 | TOPICAL_OINTMENT | Freq: Every day | CUTANEOUS | 0 refills | Status: DC
  Filled 2024-06-15 – 2024-06-17 (×2): qty 45, 30d supply, fill #0

## 2024-06-17 ENCOUNTER — Other Ambulatory Visit (HOSPITAL_COMMUNITY): Payer: Self-pay

## 2024-06-17 ENCOUNTER — Other Ambulatory Visit: Payer: Self-pay | Admitting: Internal Medicine

## 2024-06-17 DIAGNOSIS — Z1231 Encounter for screening mammogram for malignant neoplasm of breast: Secondary | ICD-10-CM

## 2024-06-18 ENCOUNTER — Other Ambulatory Visit (HOSPITAL_COMMUNITY): Payer: Self-pay

## 2024-07-08 ENCOUNTER — Ambulatory Visit
Admission: RE | Admit: 2024-07-08 | Discharge: 2024-07-08 | Disposition: A | Discharge status: Home / Self Care | Source: Ambulatory Visit | Attending: Internal Medicine | Admitting: Internal Medicine

## 2024-07-08 DIAGNOSIS — Z1231 Encounter for screening mammogram for malignant neoplasm of breast: Secondary | ICD-10-CM | POA: Diagnosis not present

## 2024-07-11 ENCOUNTER — Other Ambulatory Visit (HOSPITAL_COMMUNITY): Payer: Self-pay

## 2024-07-12 ENCOUNTER — Other Ambulatory Visit (HOSPITAL_COMMUNITY): Payer: Self-pay

## 2024-07-27 ENCOUNTER — Other Ambulatory Visit (HOSPITAL_COMMUNITY): Payer: Self-pay

## 2024-07-27 DIAGNOSIS — K219 Gastro-esophageal reflux disease without esophagitis: Secondary | ICD-10-CM | POA: Diagnosis not present

## 2024-07-27 DIAGNOSIS — E039 Hypothyroidism, unspecified: Secondary | ICD-10-CM | POA: Diagnosis not present

## 2024-07-27 DIAGNOSIS — I10 Essential (primary) hypertension: Secondary | ICD-10-CM | POA: Diagnosis not present

## 2024-07-27 DIAGNOSIS — L309 Dermatitis, unspecified: Secondary | ICD-10-CM | POA: Diagnosis not present

## 2024-07-27 DIAGNOSIS — E7800 Pure hypercholesterolemia, unspecified: Secondary | ICD-10-CM | POA: Diagnosis not present

## 2024-07-27 DIAGNOSIS — N1831 Chronic kidney disease, stage 3a: Secondary | ICD-10-CM | POA: Diagnosis not present

## 2024-07-27 DIAGNOSIS — I890 Lymphedema, not elsewhere classified: Secondary | ICD-10-CM | POA: Diagnosis not present

## 2024-07-27 DIAGNOSIS — I2510 Atherosclerotic heart disease of native coronary artery without angina pectoris: Secondary | ICD-10-CM | POA: Diagnosis not present

## 2024-07-27 DIAGNOSIS — D649 Anemia, unspecified: Secondary | ICD-10-CM | POA: Diagnosis not present

## 2024-07-27 MED ORDER — HYDROCHLOROTHIAZIDE 25 MG PO TABS
25.0000 mg | ORAL_TABLET | Freq: Every morning | ORAL | 1 refills | Status: AC
  Filled 2024-07-27: qty 90, 90d supply, fill #0

## 2024-08-10 ENCOUNTER — Other Ambulatory Visit (HOSPITAL_COMMUNITY): Payer: Self-pay

## 2024-08-18 DIAGNOSIS — I10 Essential (primary) hypertension: Secondary | ICD-10-CM | POA: Diagnosis not present

## 2024-08-18 DIAGNOSIS — M79605 Pain in left leg: Secondary | ICD-10-CM | POA: Diagnosis not present

## 2024-08-18 DIAGNOSIS — I890 Lymphedema, not elsewhere classified: Secondary | ICD-10-CM | POA: Diagnosis not present

## 2024-08-22 ENCOUNTER — Other Ambulatory Visit (HOSPITAL_COMMUNITY): Payer: Self-pay

## 2024-08-30 ENCOUNTER — Other Ambulatory Visit (HOSPITAL_COMMUNITY): Payer: Self-pay

## 2024-08-30 DIAGNOSIS — L718 Other rosacea: Secondary | ICD-10-CM | POA: Diagnosis not present

## 2024-08-30 DIAGNOSIS — L309 Dermatitis, unspecified: Secondary | ICD-10-CM | POA: Diagnosis not present

## 2024-08-30 MED ORDER — BETAMETHASONE DIPROPIONATE 0.05 % EX OINT
1.0000 | TOPICAL_OINTMENT | Freq: Every day | CUTANEOUS | 0 refills | Status: AC
  Filled 2024-08-30: qty 15, 30d supply, fill #0
  Filled 2024-09-27: qty 15, 30d supply, fill #1

## 2024-08-30 MED ORDER — DOXYCYCLINE MONOHYDRATE 100 MG PO TABS
100.0000 mg | ORAL_TABLET | Freq: Two times a day (BID) | ORAL | 0 refills | Status: AC
  Filled 2024-08-30: qty 60, 30d supply, fill #0

## 2024-09-28 ENCOUNTER — Other Ambulatory Visit (HOSPITAL_COMMUNITY): Payer: Self-pay

## 2024-10-07 ENCOUNTER — Other Ambulatory Visit (HOSPITAL_COMMUNITY): Payer: Self-pay

## 2024-10-07 ENCOUNTER — Other Ambulatory Visit: Payer: Self-pay

## 2024-10-07 MED ORDER — CLINDAMYCIN PHOSPHATE 1 % EX LOTN
TOPICAL_LOTION | CUTANEOUS | 11 refills | Status: AC
  Filled 2024-10-07: qty 60, 30d supply, fill #0

## 2024-10-07 MED ORDER — BETAMETHASONE DIPROPIONATE 0.05 % EX OINT: TOPICAL_OINTMENT | CUTANEOUS | 0 refills | Status: AC

## 2024-10-13 ENCOUNTER — Other Ambulatory Visit (HOSPITAL_COMMUNITY): Payer: Self-pay
